# Patient Record
Sex: Male | Born: 1960 | Race: White | Hispanic: No | Marital: Married | Smoking: Current every day smoker
Health system: Southern US, Community
[De-identification: ages and names within clinical notes are randomized; demographics above are authoritative.]

## PROBLEM LIST (undated history)

## (undated) DIAGNOSIS — Z8619 Personal history of other infectious and parasitic diseases: Secondary | ICD-10-CM

## (undated) DIAGNOSIS — K219 Gastro-esophageal reflux disease without esophagitis: Secondary | ICD-10-CM

## (undated) DIAGNOSIS — C4491 Basal cell carcinoma of skin, unspecified: Secondary | ICD-10-CM

## (undated) DIAGNOSIS — L409 Psoriasis, unspecified: Secondary | ICD-10-CM

## (undated) DIAGNOSIS — C61 Malignant neoplasm of prostate: Secondary | ICD-10-CM

## (undated) HISTORY — DX: Malignant neoplasm of prostate: C61

## (undated) HISTORY — DX: Gastro-esophageal reflux disease without esophagitis: K21.9

## (undated) HISTORY — DX: Basal cell carcinoma of skin, unspecified: C44.91

## (undated) HISTORY — DX: Personal history of other infectious and parasitic diseases: Z86.19

## (undated) HISTORY — DX: Psoriasis, unspecified: L40.9

---

## 2012-06-12 ENCOUNTER — Ambulatory Visit: Payer: Self-pay | Admitting: Family Medicine

## 2012-06-12 HISTORY — PX: OTHER SURGICAL HISTORY: SHX169

## 2012-06-14 HISTORY — PX: OTHER SURGICAL HISTORY: SHX169

## 2012-06-30 ENCOUNTER — Ambulatory Visit: Payer: Self-pay | Admitting: Family Medicine

## 2012-07-14 ENCOUNTER — Ambulatory Visit: Payer: Self-pay | Admitting: Family Medicine

## 2012-07-18 ENCOUNTER — Ambulatory Visit: Payer: Self-pay | Admitting: Family Medicine

## 2012-07-18 HISTORY — PX: CT SCAN: SHX5351

## 2012-07-31 HISTORY — PX: UPPER GI ENDOSCOPY: SHX6162

## 2012-07-31 LAB — HM COLONOSCOPY

## 2012-08-15 ENCOUNTER — Ambulatory Visit (INDEPENDENT_AMBULATORY_CARE_PROVIDER_SITE_OTHER): Payer: BC Managed Care – PPO | Admitting: Pulmonary Disease

## 2012-08-15 ENCOUNTER — Encounter: Payer: Self-pay | Admitting: Pulmonary Disease

## 2012-08-15 VITALS — BP 110/76 | HR 98 | Ht 69.5 in | Wt 197.0 lb

## 2012-08-15 DIAGNOSIS — R918 Other nonspecific abnormal finding of lung field: Secondary | ICD-10-CM

## 2012-08-15 DIAGNOSIS — R1011 Right upper quadrant pain: Secondary | ICD-10-CM

## 2012-08-15 DIAGNOSIS — R05 Cough: Secondary | ICD-10-CM

## 2012-08-15 DIAGNOSIS — R059 Cough, unspecified: Secondary | ICD-10-CM

## 2012-08-15 DIAGNOSIS — R911 Solitary pulmonary nodule: Secondary | ICD-10-CM

## 2012-08-15 NOTE — Assessment & Plan Note (Signed)
Mr. Troy Cuevas has some cough with sputum production which is very likely related to his 30+ pack year smoking history. He does not have evidence of COPD. It is not uncommon for people to produce sputum in the months after they have quit smoking. If this does not resolve in 2-3 months then he could be treated with an inhaler such as Foradil as long acting beta blockers have been shown to be effective for sputum production in patients with chronic bronchitis. At this point I see no indication to start treatment today.

## 2012-08-15 NOTE — Assessment & Plan Note (Signed)
I think the most likely explanation for Mr. Troy Cuevas chronic right upper abdominal pain which is exacerbated by eating food is a biliary process such as gallbladder disease versus a bile duct problem.  The only respiratory/pulmonary problem that could potentially cause pain in the area that he describes that has not been evaluated yet is a pulmonary embolism.  Plan: -CT angiogram of the chest to evaluate for pulmonary embolism -If there is no pulmonary embolism but I think he should be evaluated by a general surgeon for cholecystectomy versus a gastroenterologist for EUS

## 2012-08-15 NOTE — Patient Instructions (Addendum)
We will send you for the CT scan of your chest to look for a pulmonary embolism (this is different than the last test because it will look for a blood clot in your lung) Schedule a follow up visit with Dr. Sherrie Mustache You need to have a follow up CT of your chest in 6 months to look at the pulmonary nodules

## 2012-08-15 NOTE — Assessment & Plan Note (Signed)
Radiology was able to see 3 mm pulmonary nodules. I was unable to appreciate this. Based on his smoking history radiology has recommended that he undergo a repeat CT scan of the chest in 6 months time. This is very reasonable.

## 2012-08-15 NOTE — Progress Notes (Signed)
Subjective:    Patient ID: Troy Cuevas, male    DOB: Mar 31, 1961, 52 y.o.   MRN: [id number]  HPI  This is a 52 year old male who smoked one pack of cigarettes daily for approximately 33 years who comes her clinic today for evaluation of right-sided chest and abdominal pain. He states that in November 2013 after extending he developed the sudden onset of pain in the right chest and and right upper quadrant. This pain is constant and radiates up to his right shoulder. The pain is worsened with eating and taking a deep breath. Improves with anorexia. The pain has been persistent ever since November. He was treated for "a lung infection" by a urgent care facility but this did not improve his symptoms. He does have a cough productive of scant, white but sticky sputum worse in the mornings. He states that he quit smoking cigarettes in November when this problem occurred. He has been evaluated extensively with a CT of his chest and abdomen, a right upper quadrant ultrasound, and a colonoscopy and upper GI. All of this have been "okay". He did have a pulmonary nodule seen on chest imaging.   History reviewed. No pertinent past medical history.   Family History  Problem Relation Age of Onset  . Hypertension Mother   . Diabetes Mother   . Hypertension Father   . Diabetes Father   . Cancer Father      History   Social History  . Marital Status: Married    Spouse Name: N/A    Number of Children: N/A  . Years of Education: N/A   Occupational History  . Not on file.   Social History Main Topics  . Smoking status: Former Smoker -- 1.00 packs/day for 35 years    Types: Cigarettes    Quit date: 05/25/2012  . Smokeless tobacco: Never Used  . Alcohol Use: No  . Drug Use: No  . Sexually Active: Not on file   Other Topics Concern  . Not on file   Social History Narrative  . No narrative on file     No Known Allergies   No outpatient prescriptions prior to visit.   No  facility-administered medications prior to visit.      Review of Systems  Constitutional: Negative for fever and unexpected weight change.  HENT: Positive for sneezing. Negative for ear pain, nosebleeds, congestion, sore throat, rhinorrhea, trouble swallowing, dental problem, postnasal drip and sinus pressure.   Eyes: Negative for redness and itching.  Respiratory: Positive for cough, chest tightness, shortness of breath and wheezing.   Cardiovascular: Positive for palpitations. Negative for leg swelling.  Gastrointestinal: Negative for nausea and vomiting.  Genitourinary: Positive for dysuria and difficulty urinating.  Musculoskeletal: Negative for joint swelling.  Skin: Negative for rash.  Neurological: Positive for headaches.  Hematological: Does not bruise/bleed easily.  Psychiatric/Behavioral: Negative for dysphoric mood. The patient is not nervous/anxious.        Objective:   Physical Exam  Filed Vitals:   08/15/12 1605  BP: 110/76  Pulse: 98  Height: 5' 9.5" (1.765 m)  Weight: 197 lb (89.359 kg)  SpO2: 98%   Gen: well appearing, no acute distress HEENT: NCAT, PERRL, EOMi, OP clear, neck supple without masses PULM: CTA B CV: RRR, no mgr, no JVD AB: BS+, positive Murphy sign, bowel sounds are positive nontender nondistended no mass Ext: warm, no edema, no clubbing, no cyanosis Derm: no rash or skin breakdown Neuro: A&Ox4, CN II-XII intact, strength 5/5 in  all 4 extremities  06/12/2012 CT scan of the chest 3 mm pulmonary nodules, no other parenchymal abnormality January 2014 right upper quadrant ultrasound normal gallbladder no stones January 2014 CT scan of the abdomen no evidence of intra-abdominal pathology 08/15/2012 simple spirometry normal     Assessment & Plan:   Abdominal pain, right upper quadrant I think the most likely explanation for Troy Cuevas chronic right upper abdominal pain which is exacerbated by eating food is a biliary process such as  gallbladder disease versus a bile duct problem.  The only respiratory/pulmonary problem that could potentially cause pain in the area that he describes that has not been evaluated yet is a pulmonary embolism.  Plan: -CT angiogram of the chest to evaluate for pulmonary embolism -If there is no pulmonary embolism but I think he should be evaluated by a general surgeon for cholecystectomy versus a gastroenterologist for EUS  Pulmonary nodules Radiology was able to see 3 mm pulmonary nodules. I was unable to appreciate this. Based on his smoking history radiology has recommended that he undergo a repeat CT scan of the chest in 6 months time. This is very reasonable.  Cough Troy Cuevas has some cough with sputum production which is very likely related to his 30+ pack year smoking history. He does not have evidence of COPD. It is not uncommon for people to produce sputum in the months after they have quit smoking. If this does not resolve in 2-3 months then he could be treated with an inhaler such as Foradil as long acting beta blockers have been shown to be effective for sputum production in patients with chronic bronchitis. At this point I see no indication to start treatment today.   Updated Medication List Outpatient Encounter Prescriptions as of 08/15/2012  Medication Sig Dispense Refill  . omeprazole (PRILOSEC OTC) 20 MG tablet Take 20 mg by mouth daily.       No facility-administered encounter medications on file as of 08/15/2012.

## 2012-08-16 ENCOUNTER — Telehealth: Payer: Self-pay | Admitting: *Deleted

## 2012-08-16 NOTE — Telephone Encounter (Signed)
Message copied by Christen Butter on Wed Aug 16, 2012 12:00 PM ------      Message from: Lupita Leash      Created: Tue Aug 15, 2012  5:38 PM       Hi,            Can someone leave a message with Dr. Mila Merry on 2/19 to let him know that I would like to speak with him about Mr. Judie Grieve Bowe?  He can call me [phone] or page me [phone]            Thanks,      Heber Bolan ------

## 2012-08-16 NOTE — Telephone Encounter (Signed)
Called and spoke with Troy Cuevas and Dr. Theodis Aguas office and LM with her to have Dr. Sherrie Mustache call or page Dr Kendrick Fries Will forward to him as an FYI that this was done

## 2012-08-23 ENCOUNTER — Ambulatory Visit: Payer: Self-pay | Admitting: Pulmonary Disease

## 2012-08-25 ENCOUNTER — Telehealth: Payer: Self-pay | Admitting: Pulmonary Disease

## 2012-08-25 DIAGNOSIS — R0600 Dyspnea, unspecified: Secondary | ICD-10-CM

## 2012-08-25 NOTE — Telephone Encounter (Signed)
I spoke with pt and is aware [facility]Q [place] be [place] and will get the message over to him. Please advise thanks

## 2012-08-29 NOTE — Telephone Encounter (Signed)
Pt called back- still waiting for test results. Wants to speak to someone today. Troy Cuevas

## 2012-08-29 NOTE — Telephone Encounter (Signed)
I called him today.  Unfortunately the wrong study was ordered.  He needed a CT chest with contrast to look for pulmonary embolism.  Instead he had a CT chest with contrast to look for a pulmonary nodule.  Because of this we were unable to see if he had a blood clot or not.  I told him that we will find out how to get the CT Angio chest to evaluate for pulmonary embolism.

## 2012-08-31 NOTE — Telephone Encounter (Signed)
I spoke with Dr Allena Katz He states that he already reviewed the CT and there is no way to r/o a PE with the technique that they used since the test was ordered to evaluate nodule He states that if we are still concerned about PE, another scan will need to be ordered I have tried to call the pt and see how he is doing, but did not get an answer Spoke with Dr. Kendrick Fries and he says to go ahead and order a ct angio with cm to r/o PE I will talk with the [facility] before they contact the pt to find out an idea about the cost  Order sent to [facility]

## 2012-08-31 NOTE — Telephone Encounter (Signed)
Spoke with pt He is aware of CT date, time and directions, and NPO 2 hrs before I advised will call him with results once read Again, apologized for our mistake

## 2012-08-31 NOTE — Telephone Encounter (Signed)
He were told on Tues 3/4 that a radiologist at [facility] would review the ct and call Dr Kendrick Fries I spoke with Dr Kendrick Fries and he still had not heard anything and so I called [facility] and spoke with Herbert Seta in Radiology She gave me Dr Eliane Decree pager (he is the radiologist who read the CT) pager- [phone] I have paged him and am awaiting response

## 2012-08-31 NOTE — Telephone Encounter (Signed)
Troy Cuevas has scheduled ct chest angio for tomorrow pm at 2:15 pm  NPO 2 hrs before [facility] 3rd floor

## 2012-09-01 ENCOUNTER — Other Ambulatory Visit: Payer: BC Managed Care – PPO

## 2012-09-04 ENCOUNTER — Ambulatory Visit (INDEPENDENT_AMBULATORY_CARE_PROVIDER_SITE_OTHER)
Admission: RE | Admit: 2012-09-04 | Discharge: 2012-09-04 | Disposition: A | Discharge status: Home / Self Care | Payer: BC Managed Care – PPO | Source: Ambulatory Visit | Attending: Pulmonary Disease | Admitting: Pulmonary Disease

## 2012-09-04 DIAGNOSIS — R0600 Dyspnea, unspecified: Secondary | ICD-10-CM

## 2012-09-04 MED ORDER — IOHEXOL 350 MG/ML SOLN
80.0000 mL | Freq: Once | INTRAVENOUS | Status: AC | PRN
  Administered 2012-09-04: 80 mL via INTRAVENOUS

## 2012-09-05 ENCOUNTER — Other Ambulatory Visit: Payer: BC Managed Care – PPO

## 2012-09-06 ENCOUNTER — Telehealth: Payer: Self-pay | Admitting: Pulmonary Disease

## 2012-09-06 NOTE — Telephone Encounter (Signed)
Spoke with pt and notified CT was neg for PE Offered ov with BQ for next wk, since he states still has occ SOB He states will wait and see if he feels better and if not call us back  I advised to seek emergent care should his symptoms worsen and he verbalized understanding

## 2012-09-06 NOTE — Telephone Encounter (Signed)
I spoke with pt. He stated he has a lm from leslie to return her call. i do not see anything in EPIC. Please advise Verlon Au if you called pt with results. thanks

## 2012-09-11 ENCOUNTER — Encounter: Payer: Self-pay | Admitting: Pulmonary Disease

## 2012-09-26 ENCOUNTER — Ambulatory Visit: Payer: Self-pay | Admitting: Family Medicine

## 2013-02-06 ENCOUNTER — Ambulatory Visit: Payer: Self-pay | Admitting: Family Medicine

## 2014-02-08 ENCOUNTER — Ambulatory Visit: Payer: Self-pay | Admitting: Family Medicine

## 2014-02-27 DIAGNOSIS — C61 Malignant neoplasm of prostate: Secondary | ICD-10-CM | POA: Insufficient documentation

## 2014-02-27 DIAGNOSIS — Z8546 Personal history of malignant neoplasm of prostate: Secondary | ICD-10-CM | POA: Insufficient documentation

## 2014-02-27 LAB — LIPID PANEL
Cholesterol: 170 mg/dL (ref 0–200)
HDL: 35 mg/dL (ref 35–70)
LDL CALC: 106 mg/dL
Triglycerides: 145 mg/dL (ref 40–160)

## 2014-02-27 LAB — BASIC METABOLIC PANEL
BUN: 12 mg/dL (ref 4–21)
Creatinine: 0.9 mg/dL (ref 0.6–1.3)
Glucose: 102 mg/dL
Potassium: 4.8 mmol/L (ref 3.4–5.3)
Sodium: 138 mmol/L (ref 137–147)

## 2014-02-27 LAB — HEPATIC FUNCTION PANEL
ALT: 49 U/L — AB (ref 10–40)
AST: 37 U/L (ref 14–40)

## 2014-02-27 LAB — PSA: PSA: 25.6

## 2014-03-28 ENCOUNTER — Ambulatory Visit: Payer: Self-pay | Admitting: Urology

## 2014-12-05 IMAGING — US ABDOMEN ULTRASOUND LIMITED
1 series · 14 of 25 positions shown · non-contrast
Comparison: none

REASON FOR EXAM: RUQ Abd Pain
COMMENTS:

[Series 1: abdomen ultrasound limited · 0.25mm/px · 14 of 69 slices shown]
[im 1/69]
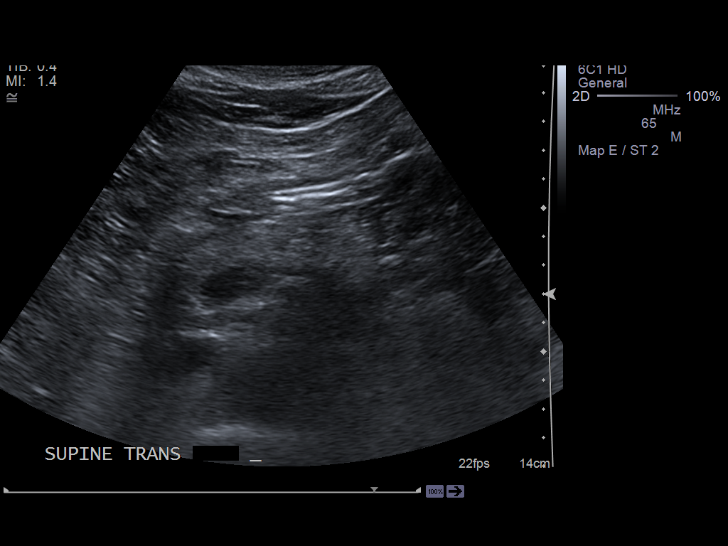
[im 6/69]
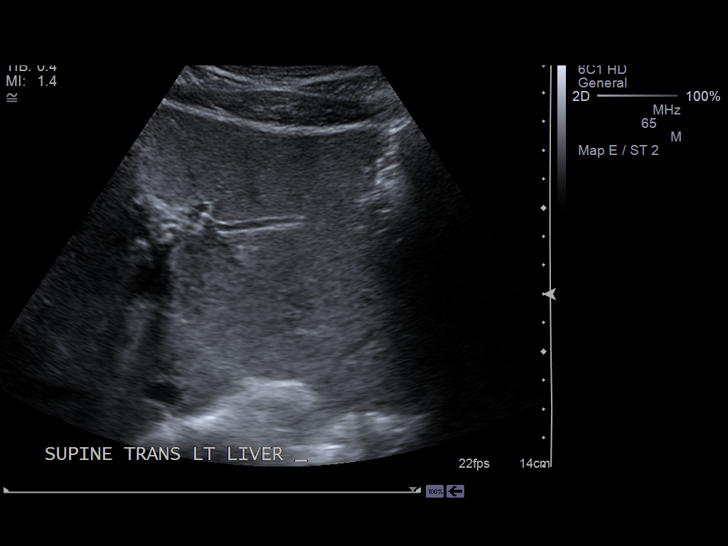
[im 12/69]
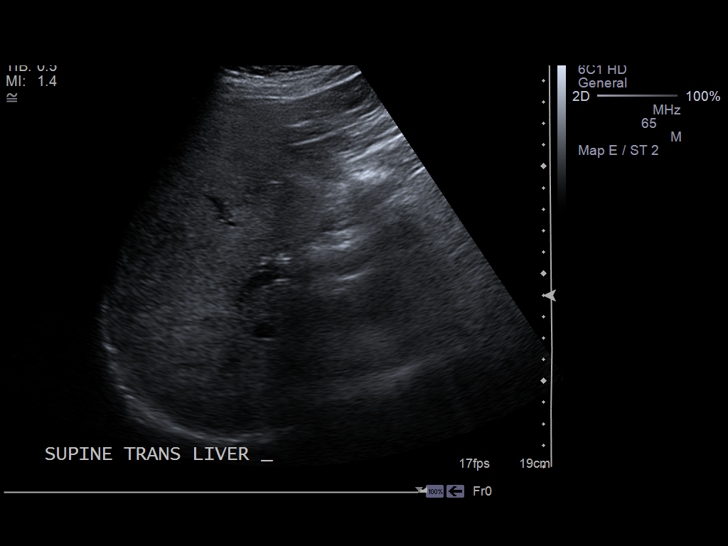
[im 18/69]
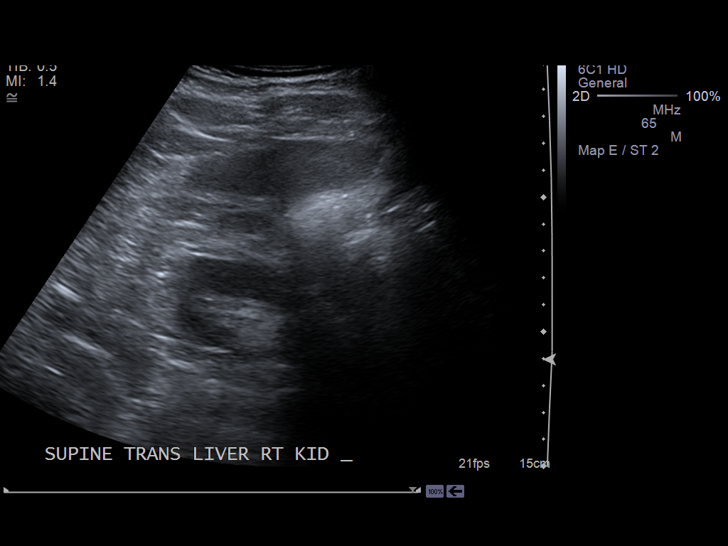
[im 23/69]
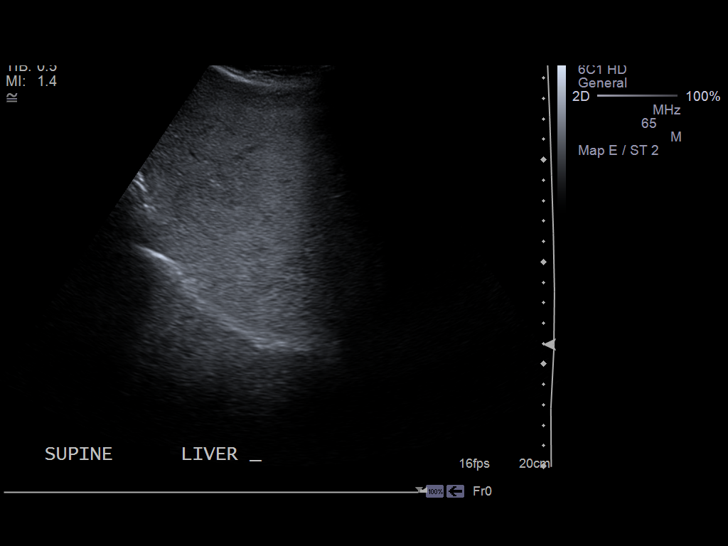
[im 26/69]
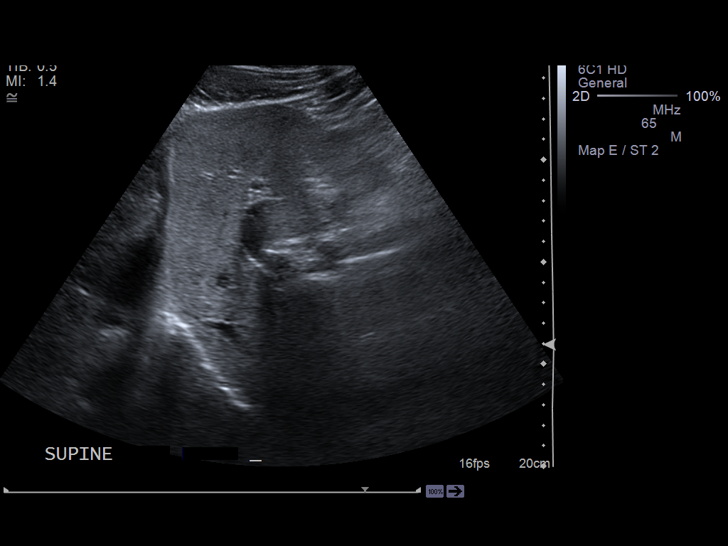
[im 32/69]
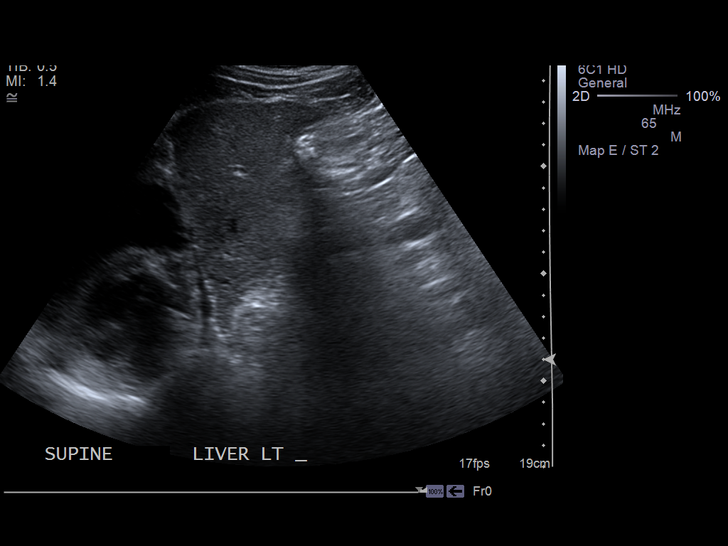
[im 37/69]
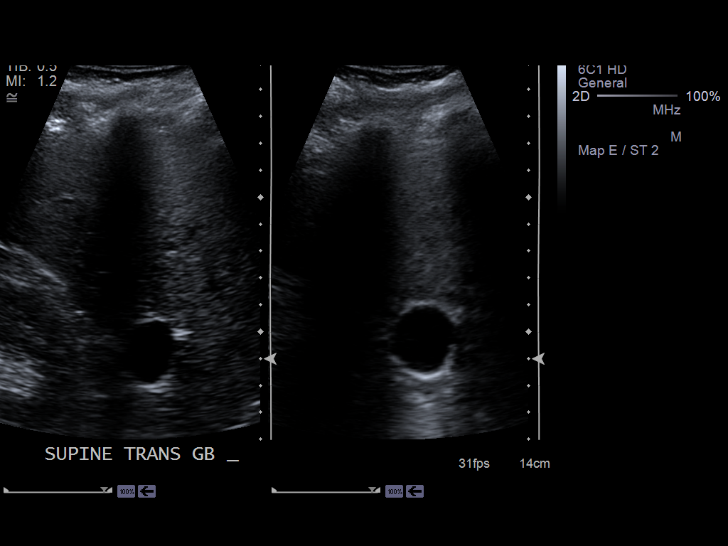
[im 43/69]
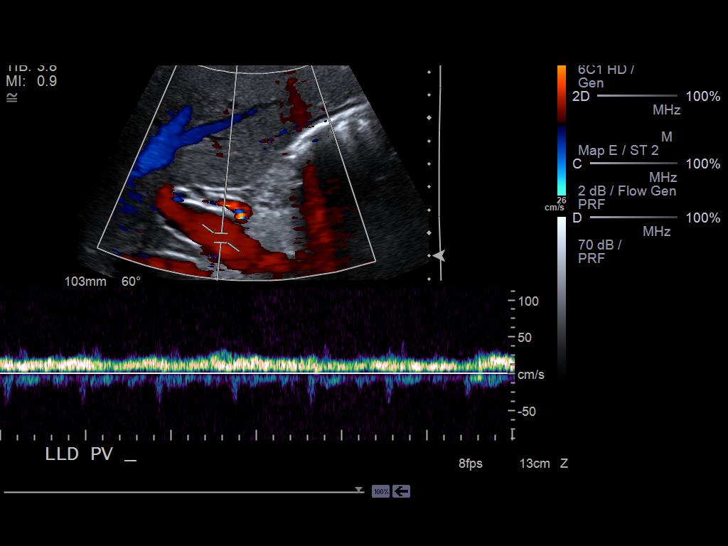
[im 46/69]
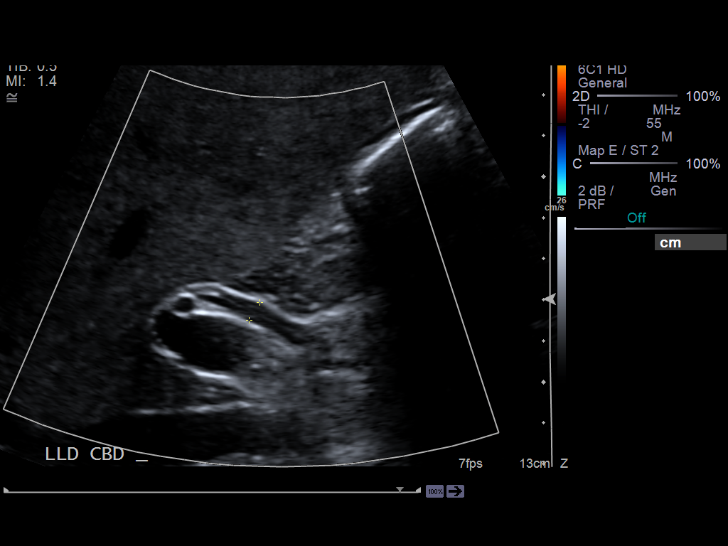
[im 52/69]
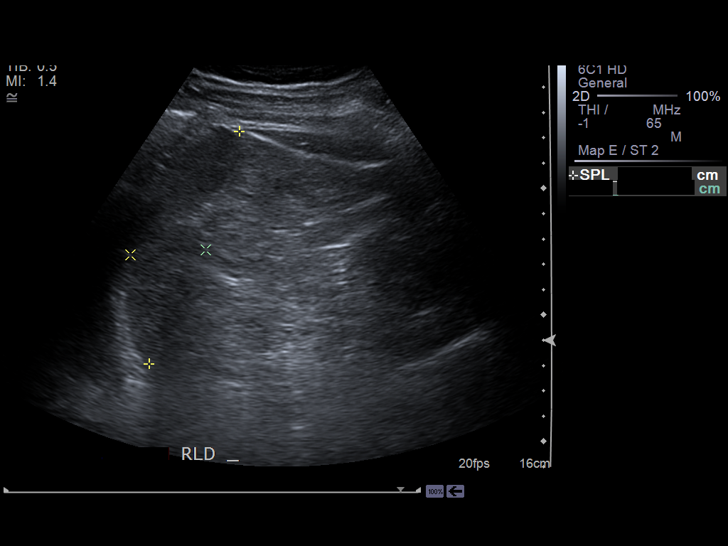
[im 57/69]
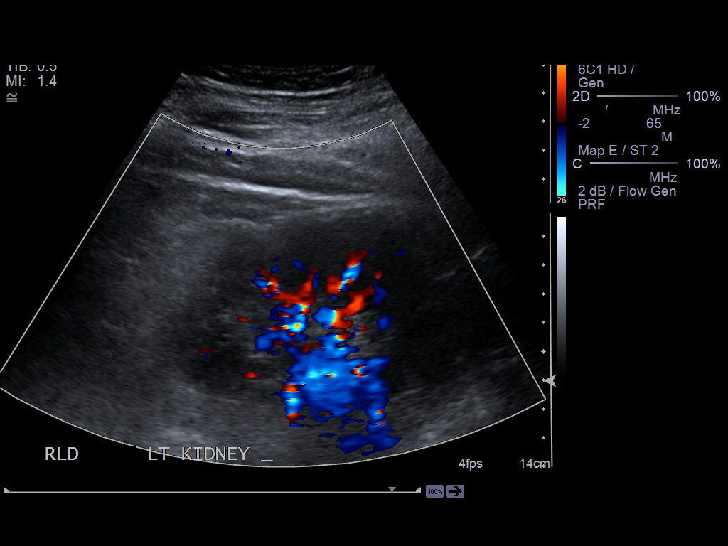
[im 63/69]
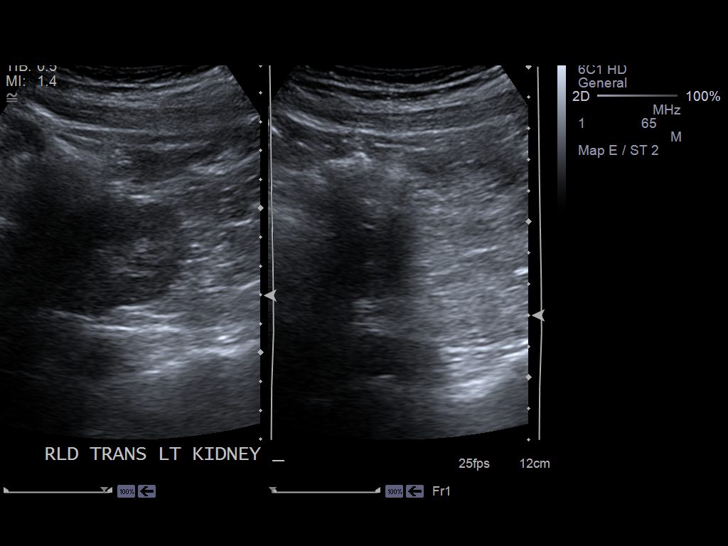
[im 69/69]
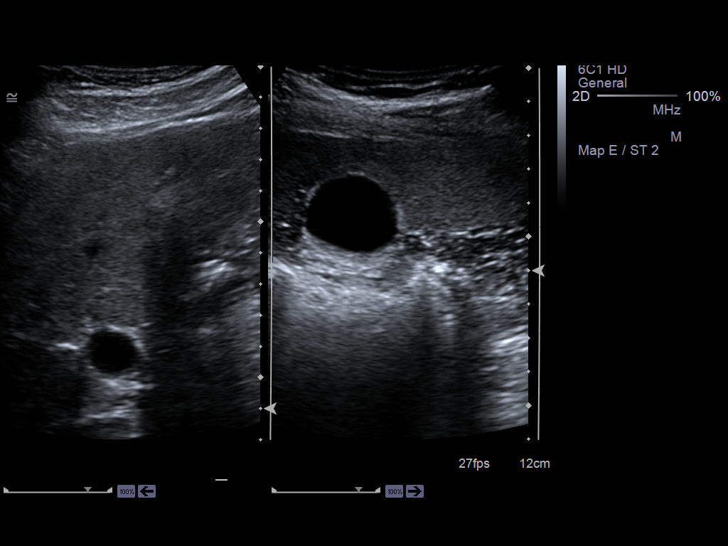

[14 of 25 positions shown; findings below may reference images not displayed]

PROCEDURE:     KIMHUNG - KIMHUNG ABDOMEN UPPER GENERAL  - June 30, 2012  [DATE]

RESULT:     Is abdominal sonogram is performed. The visualized head and body
the pancreas appear normal. The tail was not well-seen. The hepatic
echotexture appears within normal limits. There is no intrahepatic or
extrahepatic biliary ductal dilation. The common bile duct is borderline
distended at 5.1 mm. No stones are evident. Gallbladder wall thickness is
normal at 1.7 mm. Portal venous flow is normal. The proximal inferior vena
cava is patent. The aorta shows no aneurysm. The kidneys and spleen appear
normal. The spleen measures 9.6 cm. The right kidney is 11.30 x 5.46 x
cm. The left kidney measures 11.0 x 5.01 x 6.09 cm. Neither kidney shows a
stone, a solid mass or cystic mass.
IMPRESSION: 1. Poor visualization of the pancreatic tail. Common bile duct diameter is
borderline enlarged at 5.1 mm. Otherwise, unremarkable abdominal sonogram.

[REDACTED]

## 2015-01-15 ENCOUNTER — Ambulatory Visit (INDEPENDENT_AMBULATORY_CARE_PROVIDER_SITE_OTHER): Payer: BC Managed Care – PPO | Admitting: Family Medicine

## 2015-01-15 ENCOUNTER — Encounter: Payer: Self-pay | Admitting: Family Medicine

## 2015-01-15 VITALS — BP 108/78 | HR 89 | Temp 98.1°F | Resp 16 | Ht 69.0 in | Wt 204.0 lb

## 2015-01-15 DIAGNOSIS — R030 Elevated blood-pressure reading, without diagnosis of hypertension: Secondary | ICD-10-CM | POA: Diagnosis not present

## 2015-01-15 DIAGNOSIS — C4491 Basal cell carcinoma of skin, unspecified: Secondary | ICD-10-CM | POA: Insufficient documentation

## 2015-01-15 DIAGNOSIS — IMO0001 Reserved for inherently not codable concepts without codable children: Secondary | ICD-10-CM

## 2015-01-15 DIAGNOSIS — J309 Allergic rhinitis, unspecified: Secondary | ICD-10-CM | POA: Insufficient documentation

## 2015-01-15 DIAGNOSIS — R200 Anesthesia of skin: Secondary | ICD-10-CM | POA: Insufficient documentation

## 2015-01-15 DIAGNOSIS — L989 Disorder of the skin and subcutaneous tissue, unspecified: Secondary | ICD-10-CM | POA: Insufficient documentation

## 2015-01-15 DIAGNOSIS — K219 Gastro-esophageal reflux disease without esophagitis: Secondary | ICD-10-CM | POA: Insufficient documentation

## 2015-01-15 NOTE — Patient Instructions (Addendum)
Reduce caffeine intake (coffee) by half   .DASH Eating Plan DASH stands for "Dietary Approaches to Stop Hypertension." The DASH eating plan is a healthy eating plan that has been shown to reduce high blood pressure (hypertension). Additional health benefits may include reducing the risk of type 2 diabetes mellitus, heart disease, and stroke. The DASH eating plan may also help with weight loss. WHAT DO I NEED TO KNOW ABOUT THE DASH EATING PLAN? For the DASH eating plan, you will follow these general guidelines:  Choose foods with a percent daily value for sodium of less than 5% (as listed on the food label).  Use salt-free seasonings or herbs instead of table salt or sea salt.  Check with your health care provider or pharmacist before using salt substitutes.  Eat lower-sodium products, often labeled as "lower sodium" or "no salt added."  Eat fresh foods.  Eat more vegetables, fruits, and low-fat dairy products.  Choose whole grains. Look for the word "whole" as the first word in the ingredient list.  Choose fish and skinless chicken or [place] more often than red meat. Limit fish, poultry, and meat to 6 oz (170 g) each day.  Limit sweets, desserts, sugars, and sugary drinks.  Choose heart-healthy fats.  Limit cheese to 1 oz (28 g) per day.  Eat more home-cooked food and less restaurant, buffet, and fast food.  Limit fried foods.  Cook foods using methods other than frying.  Limit canned vegetables. If you do use them, rinse them well to decrease the sodium.  When eating at a restaurant, ask that your food be prepared with less salt, or no salt if possible. WHAT FOODS CAN I EAT? Seek help from a dietitian for individual calorie needs. Grains Whole grain or whole wheat bread. Brown rice. Whole grain or whole wheat pasta. Quinoa, bulgur, and whole grain cereals. Low-sodium cereals. Corn or whole wheat flour tortillas. Whole grain cornbread. Whole grain crackers. Low-sodium  crackers. Vegetables Fresh or frozen vegetables (raw, steamed, roasted, or grilled). Low-sodium or reduced-sodium tomato and vegetable juices. Low-sodium or reduced-sodium tomato sauce and paste. Low-sodium or reduced-sodium canned vegetables.  Fruits All fresh, canned (in natural juice), or frozen fruits. Meat and Other Protein Products Ground beef (85% or leaner), grass-fed beef, or beef trimmed of fat. Skinless chicken or [place]. Ground chicken or [place]. Pork trimmed of fat. All fish and seafood. Eggs. Dried beans, peas, or lentils. Unsalted nuts and seeds. Unsalted canned beans. Dairy Low-fat dairy products, such as skim or 1% milk, 2% or reduced-fat cheeses, low-fat ricotta or cottage cheese, or plain low-fat yogurt. Low-sodium or reduced-sodium cheeses. Fats and Oils Tub margarines without trans fats. Light or reduced-fat mayonnaise and salad dressings (reduced sodium). Avocado. Safflower, olive, or canola oils. Natural peanut or almond butter. Other Unsalted popcorn and pretzels. The items listed above may not be a complete list of recommended foods or beverages. Contact your dietitian for more options. WHAT FOODS ARE NOT RECOMMENDED? Grains White bread. White pasta. White rice. Refined cornbread. Bagels and croissants. Crackers that contain trans fat. Vegetables Creamed or fried vegetables. Vegetables in a cheese sauce. Regular canned vegetables. Regular canned tomato sauce and paste. Regular tomato and vegetable juices. Fruits Dried fruits. Canned fruit in light or heavy syrup. Fruit juice. Meat and Other Protein Products Fatty cuts of meat. Ribs, chicken wings, bacon, sausage, bologna, salami, chitterlings, fatback, hot dogs, bratwurst, and packaged luncheon meats. Salted nuts and seeds. Canned beans with salt. Dairy Whole or 2% milk, cream,  half-and-half, and cream cheese. Whole-fat or sweetened yogurt. Full-fat cheeses or blue cheese. Nondairy creamers and whipped toppings.  Processed cheese, cheese spreads, or cheese curds. Condiments Onion and garlic salt, seasoned salt, table salt, and sea salt. Canned and packaged gravies. Worcestershire sauce. Tartar sauce. Barbecue sauce. Teriyaki sauce. Soy sauce, including reduced sodium. Steak sauce. Fish sauce. Oyster sauce. Cocktail sauce. Horseradish. Ketchup and mustard. Meat flavorings and tenderizers. Bouillon cubes. Hot sauce. Tabasco sauce. Marinades. Taco seasonings. Relishes. Fats and Oils Butter, stick margarine, lard, shortening, ghee, and bacon fat. Coconut, palm kernel, or palm oils. Regular salad dressings. Other Pickles and olives. Salted popcorn and pretzels. The items listed above may not be a complete list of foods and beverages to avoid. Contact your dietitian for more information. WHERE CAN I FIND MORE INFORMATION? National Heart, Lung, and Blood Institute: travelstabloid.com Document Released: 06/03/2011 Document Revised: 10/29/2013 Document Reviewed: 04/18/2013 [facility] Patient Information 2015 [place], [place]. This information is not intended to replace advice given to you by your health care provider. Make sure you discuss any questions you have with your health care provider.

## 2015-01-15 NOTE — Progress Notes (Signed)
       Patient: Troy Cuevas Male    DOB: [birth year]   54 y.o.   MRN: [id number] Visit Date: 01/26/2015  Today's Provider: [name], MD   Chief Complaint  Patient presents with  . Elevated blood pressure   Subjective:    HPI  Elevated blood pressure Patient's blood pressure had been elevated to 150/95. Has felt well, no chest pain, no light-headedness. No dizziness.   No Known Allergies Previous Medications   FLUTICASONE (FLONASE) 50 MCG/ACT NASAL SPRAY    Place 2 sprays into both nostrils daily.   OMEPRAZOLE (PRILOSEC) 40 MG CAPSULE    Take 1 capsule by mouth. 30 minutes prior to evening meal    Review of Systems  Constitutional: Negative for fever and fatigue.  Respiratory: Negative for chest tightness and shortness of breath.   Cardiovascular: Negative for leg swelling.  Gastrointestinal: Positive for abdominal pain.    History  Substance Use Topics  . Smoking status: Current Some Day Smoker -- 1.00 packs/day for 35 years    Types: Cigarettes    Last Attempt to Quit: 05/25/2012  . Smokeless tobacco: Never Used  . Alcohol Use: 0.0 oz/week    0 Standard drinks or equivalent per week     Comment: occasional    Objective:   BP 140/80 mmHg  Pulse 89  Temp(Src) 98.1 F (36.7 C) (Oral)  Resp 16  Ht 5\' 9"  (1.753 m)  Wt 204 lb (92.534 kg)  BMI 30.11 kg/m2  SpO2 98%  Physical Exam  General Appearance:    Alert, cooperative, no distress  Eyes:    PERRL, conjunctiva/corneas clear, EOM's intact       Lungs:     Clear to auscultation bilaterally, respirations unlabored  Heart:    Regular rate and rhythm  Neurologic:   Awake, alert, oriented x 3. No apparent focal neurological           defect.            Assessment & Plan:     1. Elevated blood pressure Better today in the office. Will work on avoiding sodium in diet and exercise regularly.      Recheck BP one month  [name], MD  [facility]

## 2015-03-21 ENCOUNTER — Ambulatory Visit (INDEPENDENT_AMBULATORY_CARE_PROVIDER_SITE_OTHER): Payer: BC Managed Care – PPO | Admitting: Family Medicine

## 2015-03-21 ENCOUNTER — Encounter: Payer: Self-pay | Admitting: Family Medicine

## 2015-03-21 VITALS — BP 158/96 | HR 80 | Temp 98.6°F | Resp 16 | Ht 69.0 in | Wt 206.0 lb

## 2015-03-21 DIAGNOSIS — Z23 Encounter for immunization: Secondary | ICD-10-CM

## 2015-03-21 DIAGNOSIS — I10 Essential (primary) hypertension: Secondary | ICD-10-CM

## 2015-03-21 DIAGNOSIS — J309 Allergic rhinitis, unspecified: Secondary | ICD-10-CM | POA: Diagnosis not present

## 2015-03-21 DIAGNOSIS — K219 Gastro-esophageal reflux disease without esophagitis: Secondary | ICD-10-CM

## 2015-03-21 DIAGNOSIS — R030 Elevated blood-pressure reading, without diagnosis of hypertension: Secondary | ICD-10-CM

## 2015-03-21 DIAGNOSIS — IMO0001 Reserved for inherently not codable concepts without codable children: Secondary | ICD-10-CM | POA: Insufficient documentation

## 2015-03-21 DIAGNOSIS — Z0000 Encounter for general adult medical examination without abnormal findings: Secondary | ICD-10-CM | POA: Diagnosis not present

## 2015-03-21 NOTE — Progress Notes (Signed)
Patient ID: Troy Cuevas, male   DOB: Jun 28, 1961, 54 y.o.   MRN: [id number]       Patient: Troy Cuevas, Male    DOB: 11-12-1960, 54 y.o.   MRN: [id number] Visit Date: 03/21/2015  Today's Provider: [name], MD   Chief Complaint  Patient presents with  . Annual Exam   Subjective:    Annual physical exam Troy Cuevas is a 54 y.o. male who presents today for health maintenance and complete physical. He feels well. He reports exercising not regularly. He reports he is sleeping well. Patient is due for Tdap and Flu vaccine. He will update on today's visit.  -----------------------------------------------------------------   Review of Systems  Constitutional: Negative.  Negative for fever, chills, appetite change and fatigue.  HENT: Positive for congestion. Negative for ear pain, hearing loss, nosebleeds and trouble swallowing.   Eyes: Negative.  Negative for pain and visual disturbance.  Respiratory: Negative.  Negative for cough, chest tightness and shortness of breath.   Cardiovascular: Negative.  Negative for chest pain, palpitations and leg swelling.  Gastrointestinal: Negative.  Negative for nausea, vomiting, abdominal pain, diarrhea, constipation and blood in stool.  Endocrine: Negative.  Negative for polydipsia, polyphagia and polyuria.  Genitourinary: Negative.  Negative for dysuria and flank pain.  Musculoskeletal: Positive for neck pain. Negative for myalgias, back pain, joint swelling, arthralgias and neck stiffness.  Skin: Negative.  Negative for color change, rash and wound.  Allergic/Immunologic: Negative.   Neurological: Negative.  Negative for dizziness, tremors, seizures, speech difficulty, weakness, light-headedness and headaches.  Hematological: Negative.   Psychiatric/Behavioral: Negative.  Negative for behavioral problems, confusion, sleep disturbance, dysphoric mood and decreased concentration. The patient is not nervous/anxious.   All other  systems reviewed and are negative.   Social History He  reports that he has been smoking Cigarettes.  He has a 35 pack-year smoking history. He has never used smokeless tobacco. He reports that he drinks alcohol. He reports that he does not use illicit drugs. Social History   Social History  . Marital Status: Married    Spouse Name: N/A  . Number of Children: N/A  . Years of Education: 12   Occupational History  . 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 Smoking status: Current Some Day Smoker -- 1.00 packs/day for 35 years    Types: Cigarettes    Last Attempt to Quit: 05/25/2012  . Smokeless tobacco: Never Used  . Alcohol Use: 0.0 oz/week    0 Standard drinks or equivalent per week     Comment: occasional   . Drug Use: No  . Sexual Activity: Not Asked   Other Topics Concern  . None   Social History Narrative    Patient Active Problem List   Diagnosis Date Noted  . Allergic rhinitis 01/15/2015  . Basal cell carcinoma 01/15/2015  . GERD (gastroesophageal reflux disease) 01/15/2015  . Lung nodule, multiple 01/15/2015  . Skin lesion 01/15/2015  . Prostate cancer 02/27/2014  . Abdominal pain, right upper quadrant 08/15/2012  . Pulmonary nodules 08/15/2012  . Cough 08/15/2012    Past Surgical History  Procedure Laterality Date  . Upper gi endoscopy  07/31/2012    Dr. Dionne Milo, H. Pylori neg; Normal biopsy gastric antrum  . Ct scan  07/18/2012    CT of  Abdomen; Scattered Diverticulosis of left colon  . Myocardial perfusion scan  06/14/2012    Normal  . Ct scan of chest  06/12/2012  with contrast; 3 tiny nodules in both Lungs. Recommned repeat in 6 months. Otherwise normal    Family History  Family Status  Relation Status Death Age  . Mother Alive   . Father Alive   . Sister Alive   . Brother Alive   . Sister Alive    His family history includes Cancer in his father; Diabetes in his father, mother, and sister; Hypertension in his father and  mother.    No Known Allergies  Previous Medications   FLUTICASONE (FLONASE) 50 MCG/ACT NASAL SPRAY    Place 2 sprays into both nostrils daily.   OMEPRAZOLE (PRILOSEC) 40 MG CAPSULE    Take 1 capsule by mouth. 30 minutes prior to evening meal    Patient Care Team: Birdie Sons, MD as PCP - General (Family Medicine)     Objective:   Filed Vitals:   03/21/15 1404 03/21/15 1442  BP: 158/82 158/96  Pulse: 80   Temp: 98.6 F (37 C)   Resp: 16   Height: 5\' 9"  (1.753 m)   Weight: 206 lb (93.441 kg)      Physical Exam   General Appearance:    Alert, cooperative, no distress, appears stated age, obese  Head:    Normocephalic, without obvious abnormality, atraumatic  Eyes:    PERRL, conjunctiva/corneas clear, EOM's intact, fundi    benign, both eyes       Ears:    Normal TM's and external ear canals, both ears  Nose:   Nares normal, septum midline, mucosa normal, no drainage   or sinus tenderness  Throat:   Lips, mucosa, and tongue normal; teeth and gums normal  Neck:   Supple, symmetrical, trachea midline, no adenopathy;       thyroid:  No enlargement/tenderness/nodules; no carotid   bruit or JVD  Back:     Symmetric, no curvature, ROM normal, no CVA tenderness  Lungs:     Clear to auscultation bilaterally, respirations unlabored  Chest wall:    No tenderness or deformity  Heart:    Regular rate and rhythm, S1 and S2 normal, no murmur, rub   or gallop  Abdomen:     Soft, non-tender, bowel sounds active all four quadrants,    no masses, no organomegaly  Genitalia:    deferred  Rectal:    deferred  Extremities:   Extremities normal, atraumatic, no cyanosis or edema  Pulses:   2+ and symmetric all extremities  Skin:   Skin color, texture, turgor normal, no rashes or lesions  Lymph nodes:   Cervical, supraclavicular, and axillary nodes normal  Neurologic:   CNII-XII intact. Normal strength, sensation and reflexes      throughout    Depression Screen No flowsheet data  found.    Assessment & Plan:     Routine Health Maintenance and Physical Exam  Exercise Activities and Dietary recommendations Goals    None       There is no immunization history on file for this patient.  Health Maintenance  Topic Date Due  . HIV Screening  12/12/1976  . TETANUS/TDAP  12/12/1980  . INFLUENZA VACCINE  01/27/2015  . COLONOSCOPY  07/31/2022  . Hepatitis C Screening  Completed      Discussed health benefits of physical activity, and encouraged him to engage in regular exercise appropriate for his age and condition.    --------------------------------------------------------------------  1. Annual physical exam  - Lipid panel - Comprehensive metabolic panel  2. Essential hypertension See how labs  look. Anticipate starting antihypertensive medication once we review labs.  - EKG 12-Lead - TSH  3. Gastroesophageal reflux disease, esophagitis presence not specified well controlled   4. Allergic rhinitis, unspecified allergic rhinitis type Doing well with steroid nasal spray  5. Need for Tdap vaccination  - Tdap vaccine greater than or equal to 7yo IM  6. Need for influenza vaccination  - Flu Vaccine QUAD 36+ mos IM  7. Tobacco use Smoking cessation instruction/counseling given:  counseled patient on the dangers of tobacco use, advised patient to stop smoking, and reviewed strategies to maximize success

## 2015-03-21 NOTE — Patient Instructions (Signed)
Please stop smoking 

## 2015-03-22 LAB — COMPREHENSIVE METABOLIC PANEL
A/G RATIO: 2.3 (ref 1.1–2.5)
ALBUMIN: 4.5 g/dL (ref 3.5–5.5)
ALK PHOS: 74 IU/L (ref 39–117)
ALT: 41 IU/L (ref 0–44)
AST: 27 IU/L (ref 0–40)
BUN / CREAT RATIO: 15 (ref 9–20)
BUN: 13 mg/dL (ref 6–24)
Bilirubin Total: <0.2 mg/dL (ref 0.0–1.2)
CALCIUM: 9.5 mg/dL (ref 8.7–10.2)
CO2: 26 mmol/L (ref 18–29)
Chloride: 99 mmol/L (ref 97–108)
Creatinine, Ser: 0.89 mg/dL (ref 0.76–1.27)
GFR calc Af Amer: 113 mL/min/{1.73_m2} (ref >59)
GFR, EST NON AFRICAN AMERICAN: 98 mL/min/{1.73_m2} (ref >59)
Globulin, Total: 2 g/dL (ref 1.5–4.5)
Glucose: 102 mg/dL — ABNORMAL HIGH (ref 65–99)
POTASSIUM: 4.8 mmol/L (ref 3.5–5.2)
SODIUM: 139 mmol/L (ref 134–144)
Total Protein: 6.5 g/dL (ref 6.0–8.5)

## 2015-03-22 LAB — LIPID PANEL
CHOLESTEROL TOTAL: 207 mg/dL — AB (ref 100–199)
Chol/HDL Ratio: 4.9 ratio units (ref 0.0–5.0)
HDL: 42 mg/dL (ref >39)
LDL Calculated: 93 mg/dL (ref 0–99)
TRIGLYCERIDES: 358 mg/dL — AB (ref 0–149)
VLDL CHOLESTEROL CAL: 72 mg/dL — AB (ref 5–40)

## 2015-03-22 LAB — TSH: TSH: 0.831 u[IU]/mL (ref 0.450–4.500)

## 2015-03-25 ENCOUNTER — Telehealth: Payer: Self-pay | Admitting: *Deleted

## 2015-03-25 MED ORDER — LISINOPRIL 10 MG PO TABS: 10.0000 mg | ORAL_TABLET | Freq: Every day | ORAL | Status: DC

## 2015-03-25 NOTE — Telephone Encounter (Signed)
Patient's wife was given results. Expressed understanding. Rx sent to pharmacy.

## 2015-03-25 NOTE — Telephone Encounter (Signed)
-----   Message from Birdie Sons, MD sent at 03/23/2015 10:03 PM EDT ----- Triglycerides are a little high at 358, should be under 200. Avoid sweets and starches in diet. Otherwise labs are normal.  Recommend he start lisinopril 10mg  daily for blood pressure. +30, rf x 0. Follow up in 3-4 weeks for BP check.

## 2015-04-21 ENCOUNTER — Ambulatory Visit (INDEPENDENT_AMBULATORY_CARE_PROVIDER_SITE_OTHER): Payer: BC Managed Care – PPO | Admitting: Family Medicine

## 2015-04-21 ENCOUNTER — Encounter: Payer: Self-pay | Admitting: Family Medicine

## 2015-04-21 VITALS — BP 102/62 | HR 75 | Temp 98.6°F | Resp 16 | Wt 206.0 lb

## 2015-04-21 DIAGNOSIS — R030 Elevated blood-pressure reading, without diagnosis of hypertension: Secondary | ICD-10-CM | POA: Diagnosis not present

## 2015-04-21 DIAGNOSIS — IMO0001 Reserved for inherently not codable concepts without codable children: Secondary | ICD-10-CM

## 2015-04-21 NOTE — Progress Notes (Signed)
       Patient: Troy Cuevas Male    DOB: 01/17/1961   54 y.o.   MRN: [id number] Visit Date: 04/21/2015  Today's Provider: [name], MD   Chief Complaint  Patient presents with  . Hypertension    follow  up    Subjective:    HPI  Hypertension, follow-up:  BP Readings from Last 3 Encounters:  04/21/15 102/62  03/21/15 158/96  01/26/15 108/78    He was last seen for hypertension 4 weeks ago.  BP at that visit was 158/82. Management since that visit includes starting Lisinopril 10mg  daily. He reports good compliance with treatment. He is having side effects. Started having a dry cough 2-3 weeks ago.  He is exercising. He is adherent to low salt diet.   Outside blood pressures are not being checked. He is experiencing none.  Patient denies chest pain, chest pressure/discomfort, claudication, dyspnea, exertional chest pressure/discomfort, fatigue, irregular heart beat, lower extremity edema, near-syncope, orthopnea, palpitations, paroxysmal nocturnal dyspnea, syncope and tachypnea.   Cardiovascular risk factors include hypertension and smoking/ tobacco exposure male gender.  Use of agents associated with hypertension: none.     Weight trend: stable Wt Readings from Last 3 Encounters:  04/21/15 206 lb (93.441 kg)  03/21/15 206 lb (93.441 kg)  01/15/15 204 lb (92.534 kg)    Current diet: in general, a "healthy" diet    ------------------------------------------------------------------------      No Known Allergies Previous Medications   FLUTICASONE (FLONASE) 50 MCG/ACT NASAL SPRAY    Place 2 sprays into both nostrils daily.   LISINOPRIL (PRINIVIL,ZESTRIL) 10 MG TABLET    Take 1 tablet (10 mg total) by mouth daily.   OMEPRAZOLE (PRILOSEC) 40 MG CAPSULE    Take 1 capsule by mouth. 30 minutes prior to evening meal    Review of Systems  Constitutional: Negative for fever, chills and appetite change.  Respiratory: Positive for cough. Negative for chest  tightness, shortness of breath and wheezing.   Cardiovascular: Negative for chest pain and palpitations.  Gastrointestinal: Negative for nausea, vomiting and abdominal pain.    Social History  Substance Use Topics  . Smoking status: Current Some Day Smoker -- 1.00 packs/day for 35 years    Types: Cigarettes    Last Attempt to Quit: 05/25/2012  . Smokeless tobacco: Never Used  . Alcohol Use: 0.0 oz/week    0 Standard drinks or equivalent per week     Comment: occasional    Objective:   BP 102/62 mmHg  Pulse 75  Temp(Src) 98.6 F (37 C) (Oral)  Resp 16  Wt 206 lb (93.441 kg)  SpO2 97%  Physical Exam   General Appearance:    Alert, cooperative, no distress  Eyes:    PERRL, conjunctiva/corneas clear, EOM's intact       Lungs:     Clear to auscultation bilaterally, respirations unlabored  Heart:    Regular rate and rhythm  Neurologic:   Awake, alert, oriented x 3. No apparent focal neurological           defect.           Assessment & Plan:     1. Elevated blood pressure Greatly improved with addition of lisinopril. Needs refill if labs are normal.  - Renal function panel       [name], MD  [facility]

## 2015-04-23 LAB — RENAL FUNCTION PANEL
ALBUMIN: 4.2 g/dL (ref 3.5–5.5)
BUN / CREAT RATIO: 17 (ref 9–20)
BUN: 13 mg/dL (ref 6–24)
CO2: 24 mmol/L (ref 18–29)
Calcium: 9.1 mg/dL (ref 8.7–10.2)
Chloride: 101 mmol/L (ref 97–106)
Creatinine, Ser: 0.76 mg/dL (ref 0.76–1.27)
GFR, EST AFRICAN AMERICAN: 120 mL/min/{1.73_m2} (ref >59)
GFR, EST NON AFRICAN AMERICAN: 104 mL/min/{1.73_m2} (ref >59)
GLUCOSE: 123 mg/dL — AB (ref 65–99)
POTASSIUM: 3.9 mmol/L (ref 3.5–5.2)
Phosphorus: 3.9 mg/dL (ref 2.5–4.5)
Sodium: 141 mmol/L (ref 136–144)

## 2015-04-24 ENCOUNTER — Other Ambulatory Visit: Payer: Self-pay | Admitting: Family Medicine

## 2015-05-15 ENCOUNTER — Other Ambulatory Visit: Payer: Self-pay | Admitting: Family Medicine

## 2015-06-01 ENCOUNTER — Other Ambulatory Visit: Payer: Self-pay | Admitting: Family Medicine

## 2015-09-24 ENCOUNTER — Other Ambulatory Visit: Payer: Self-pay | Admitting: Family Medicine

## 2015-10-23 ENCOUNTER — Other Ambulatory Visit: Payer: Self-pay | Admitting: Family Medicine

## 2015-12-19 ENCOUNTER — Other Ambulatory Visit: Payer: Self-pay | Admitting: Family Medicine

## 2016-01-17 ENCOUNTER — Other Ambulatory Visit: Payer: Self-pay | Admitting: Family Medicine

## 2016-03-18 ENCOUNTER — Other Ambulatory Visit: Payer: Self-pay | Admitting: Family Medicine

## 2016-04-06 ENCOUNTER — Encounter: Payer: Self-pay | Admitting: Family Medicine

## 2016-04-06 ENCOUNTER — Ambulatory Visit (INDEPENDENT_AMBULATORY_CARE_PROVIDER_SITE_OTHER): Payer: BC Managed Care – PPO | Admitting: Family Medicine

## 2016-04-06 VITALS — BP 132/78 | HR 88 | Temp 97.9°F | Resp 16 | Wt 213.0 lb

## 2016-04-06 DIAGNOSIS — I10 Essential (primary) hypertension: Secondary | ICD-10-CM | POA: Insufficient documentation

## 2016-04-06 DIAGNOSIS — Z720 Tobacco use: Secondary | ICD-10-CM

## 2016-04-06 DIAGNOSIS — K219 Gastro-esophageal reflux disease without esophagitis: Secondary | ICD-10-CM | POA: Diagnosis not present

## 2016-04-06 DIAGNOSIS — Z23 Encounter for immunization: Secondary | ICD-10-CM

## 2016-04-06 DIAGNOSIS — F17210 Nicotine dependence, cigarettes, uncomplicated: Secondary | ICD-10-CM | POA: Insufficient documentation

## 2016-04-06 NOTE — Progress Notes (Signed)
Patient: Troy Cuevas Male    DOB: 12-18-1960   55 y.o.   MRN: [id number] Visit Date: 04/06/2016  Today's Provider: [name], MD   Chief Complaint  Patient presents with  . Hypertension  . Gastroesophageal Reflux   Subjective:    HPI  Hypertension, follow-up:  BP Readings from Last 3 Encounters:  04/06/16 132/78  04/21/15 102/62  03/21/15 (!) 158/96    He was last seen for hypertension 12 months ago.  BP at that visit was 102/62. Management since that visit includes no changes. He reports good compliance with treatment. He is not having side effects.  He is not exercising. He is adherent to low salt diet.   Outside blood pressures are none. Patient denies chest pain, exertional chest pressure/discomfort, irregular heart beat, lower extremity edema and orthopnea.      Weight trend: stable Wt Readings from Last 3 Encounters:  04/06/16 213 lb (96.6 kg)  04/21/15 206 lb (93.4 kg)  03/21/15 206 lb (93.4 kg)    Current diet: well balanced    GERD, Follow up: Patient also comes in today to follow up on GERD. Patient reports that omeprazole has been controlling his symptoms well.   No Known Allergies   Current Outpatient Prescriptions:  .  fluticasone (FLONASE) 50 MCG/ACT nasal spray, PLACE 2 SPRAYS IN EACH NOSTRIL DAILY, Disp: 16 g, Rfl: 3 .  lisinopril (PRINIVIL,ZESTRIL) 10 MG tablet, TAKE 1 TABLET (10 MG TOTAL) BY MOUTH DAILY., Disp: 30 tablet, Rfl: 6 .  omeprazole (PRILOSEC) 40 MG capsule, 1 CAPSULE DR, ORAL, 30 MINUTES PRIOR TO EVENING MEAL, Disp: 30 capsule, Rfl: 11  Review of Systems  Constitutional: Negative.   Respiratory: Negative.   Cardiovascular: Negative.   Musculoskeletal: Negative.   Neurological: Negative.   Psychiatric/Behavioral: Negative.     Social History  Substance Use Topics  . Smoking status: Current Some Day Smoker    Packs/day: 1.00    Years: 35.00    Types: Cigarettes    Last attempt to quit: 05/25/2012  .  Smokeless tobacco: Never Used  . Alcohol use 0.0 oz/week     Comment: occasional    Objective:   BP 132/78 (BP Location: Right Arm, Patient Position: Sitting, Cuff Size: Normal)   Pulse 88   Temp 97.9 F (36.6 C)   Resp 16   Wt 213 lb (96.6 kg)   BMI 31.45 kg/m   Physical Exam  General Appearance:    Alert, cooperative, no distress, obese  Eyes:    PERRL, conjunctiva/corneas clear, EOM's intact       Lungs:     Occasional expiratory wheeze, no rales, , respirations unlabored  Heart:    Regular rate and rhythm  Neurologic:   Awake, alert, oriented x 3. No apparent focal neurological           defect.           Assessment & Plan:     1. Benign hypertension Well controlled.  Continue current medications.   - Lipid panel - Renal function panel  2. Gastroesophageal reflux disease, esophagitis presence not specified Completely controlled on omeprazole. Continue current medications.    3. Need for influenza vaccination  - Flu Vaccine QUAD 36+ mos PF IM (Fluarix & Fluzone Quad PF)   4. Smoker Counseled on benefits of smoking cessation.     The entirety of the information documented in the History of Present Illness, Review of Systems and Physical Exam were  personally obtained by me. Portions of this information were initially documented by Wilburt Finlay, CMA and reviewed by me for thoroughness and accuracy.    [name], MD  [facility]

## 2016-04-07 DIAGNOSIS — Z23 Encounter for immunization: Secondary | ICD-10-CM

## 2016-04-09 ENCOUNTER — Encounter: Payer: Self-pay | Admitting: Family Medicine

## 2016-04-09 DIAGNOSIS — E785 Hyperlipidemia, unspecified: Secondary | ICD-10-CM | POA: Insufficient documentation

## 2016-04-09 DIAGNOSIS — E781 Pure hyperglyceridemia: Secondary | ICD-10-CM | POA: Insufficient documentation

## 2016-04-09 LAB — LIPID PANEL
CHOLESTEROL TOTAL: 178 mg/dL (ref 100–199)
Chol/HDL Ratio: 5.7 ratio units — ABNORMAL HIGH (ref 0.0–5.0)
HDL: 31 mg/dL — AB (ref >39)
LDL CALC: 80 mg/dL (ref 0–99)
Triglycerides: 335 mg/dL — ABNORMAL HIGH (ref 0–149)
VLDL CHOLESTEROL CAL: 67 mg/dL — AB (ref 5–40)

## 2016-04-09 LAB — RENAL FUNCTION PANEL
ALBUMIN: 4.3 g/dL (ref 3.5–5.5)
BUN / CREAT RATIO: 21 — AB (ref 9–20)
BUN: 18 mg/dL (ref 6–24)
CO2: 23 mmol/L (ref 18–29)
Calcium: 9.1 mg/dL (ref 8.7–10.2)
Chloride: 99 mmol/L (ref 96–106)
Creatinine, Ser: 0.85 mg/dL (ref 0.76–1.27)
GFR, EST AFRICAN AMERICAN: 114 mL/min/{1.73_m2} (ref >59)
GFR, EST NON AFRICAN AMERICAN: 99 mL/min/{1.73_m2} (ref >59)
Glucose: 115 mg/dL — ABNORMAL HIGH (ref 65–99)
Phosphorus: 3.4 mg/dL (ref 2.5–4.5)
Potassium: 4.7 mmol/L (ref 3.5–5.2)
Sodium: 138 mmol/L (ref 134–144)

## 2016-04-12 ENCOUNTER — Telehealth: Payer: Self-pay | Admitting: Family Medicine

## 2016-04-12 NOTE — Telephone Encounter (Signed)
error 

## 2016-05-13 ENCOUNTER — Other Ambulatory Visit: Payer: Self-pay | Admitting: Family Medicine

## 2016-07-15 IMAGING — CT CT CHEST W/O CM
2 of 3 series · 15 of 36 positions shown, 18 images · non-contrast
Comparison: February 06, 2013

CLINICAL DATA: Pulmonary nodule ; chest pain

EXAM:
CT CHEST WITHOUT CONTRAST
TECHNIQUE: Multidetector CT imaging of the chest was performed following the
standard protocol without intravenous contrast material.

[Series 2: routine chest wo · axial · 0.78mm/px · z∈[-822,-522]mm · 12 of 72 slices shown, 15 images]
[im 6/72  mediastinal]
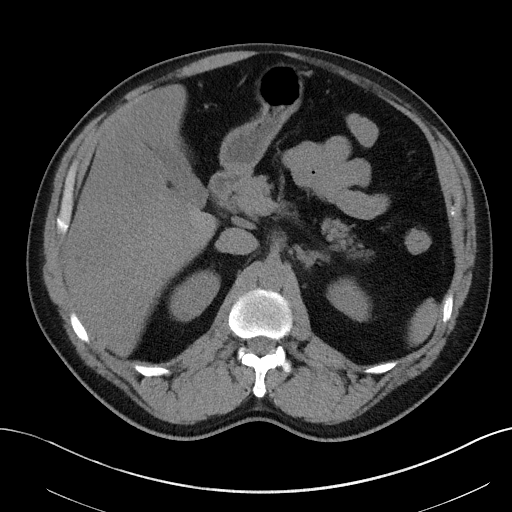
[im 6/72  lung]
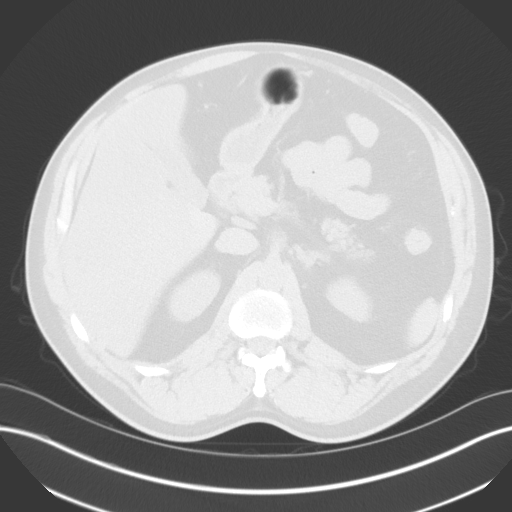
[im 11/72  lung]
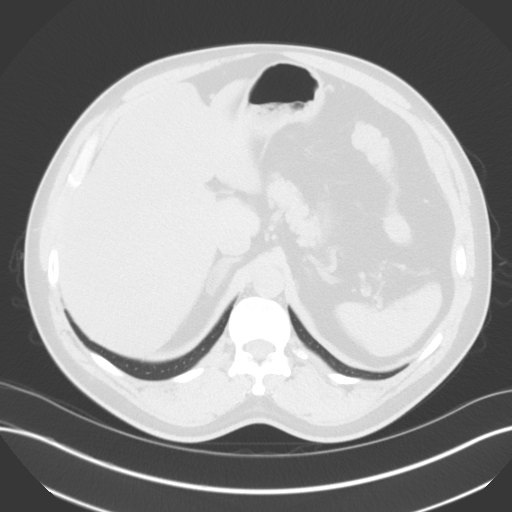
[im 16/72  lung]
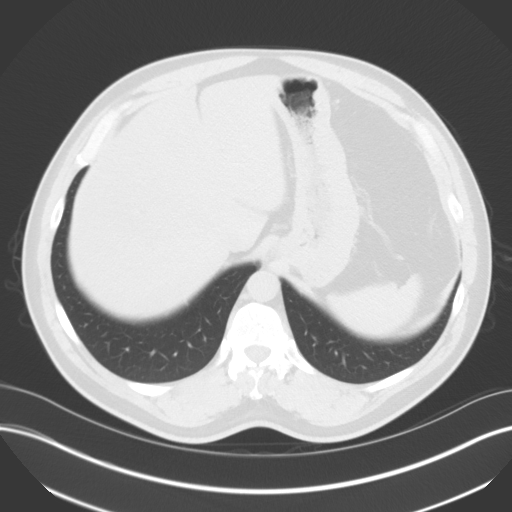
[im 22/72  lung]
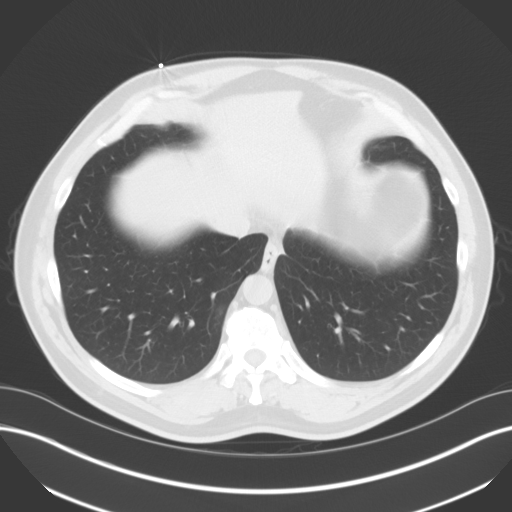
[im 27/72  mediastinal]
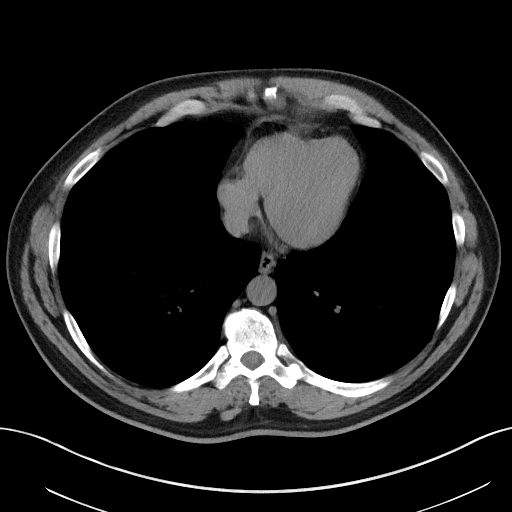
[im 27/72  lung]
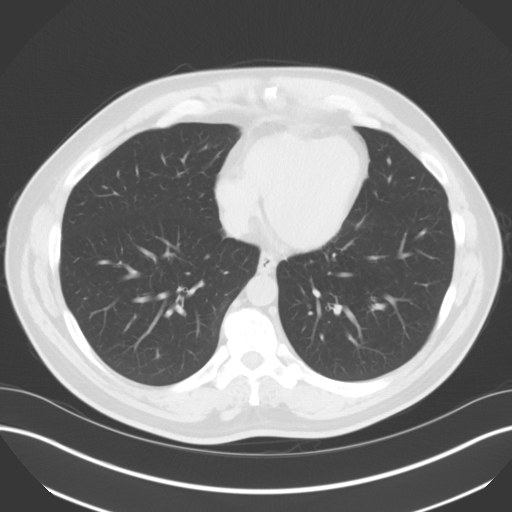
[im 32/72  lung]
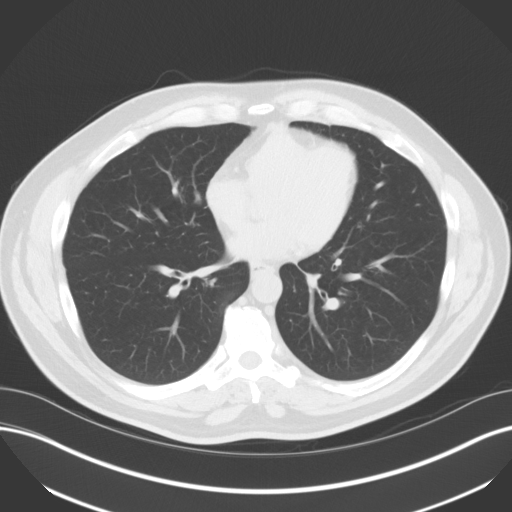
[im 40/72  lung]
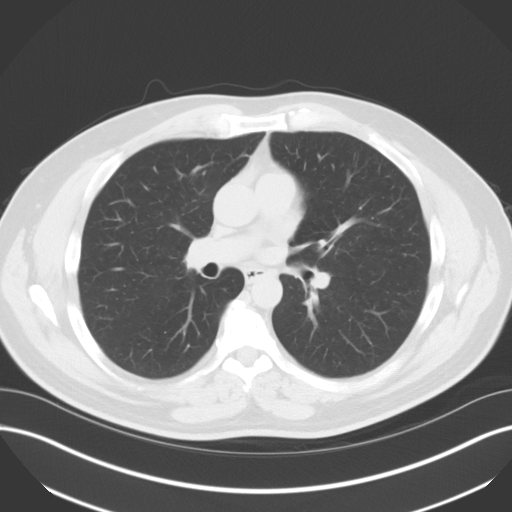
[im 45/72  lung]
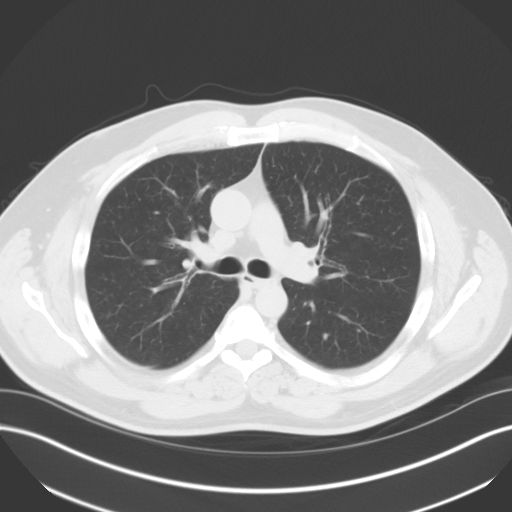
[im 50/72  mediastinal]
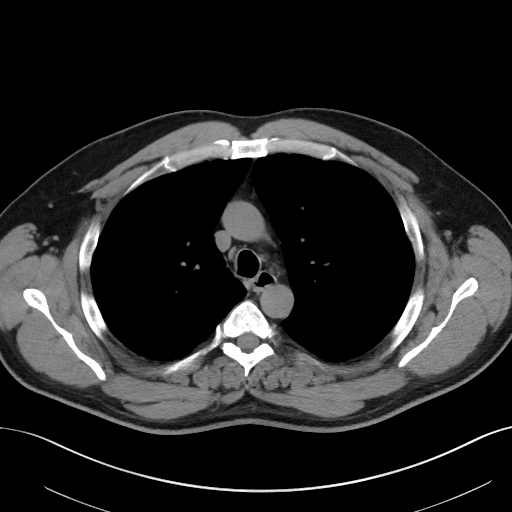
[im 50/72  lung]
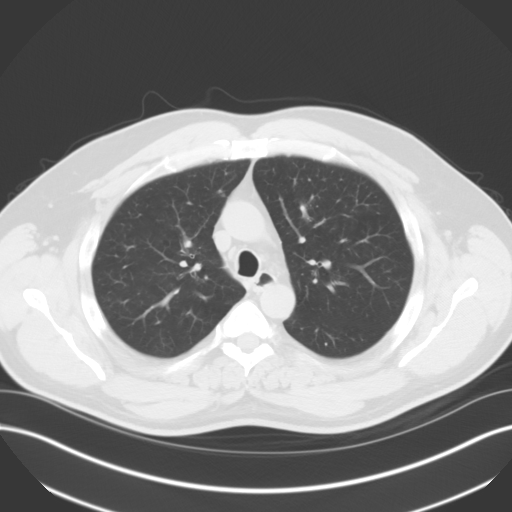
[im 56/72  lung]
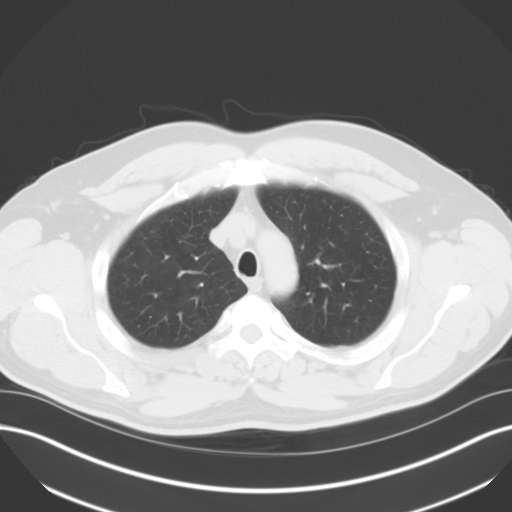
[im 61/72  lung]
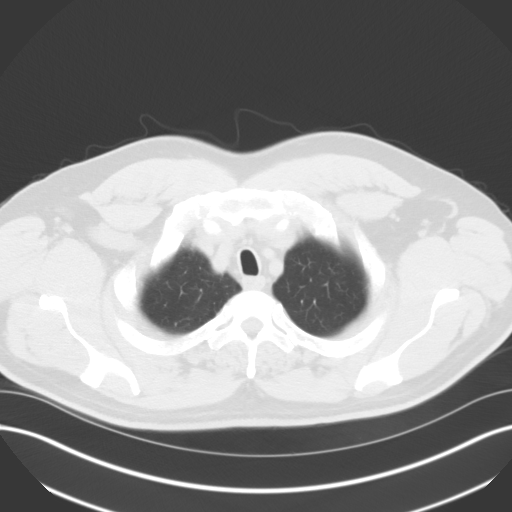
[im 66/72  lung]
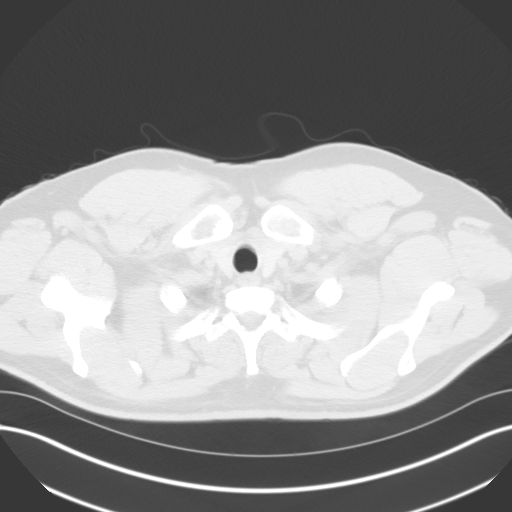

[Series 5: cor routine chest wo · coronal · 0.70mm/px · 3 of 164 slices shown]
[im 33/164  lung]
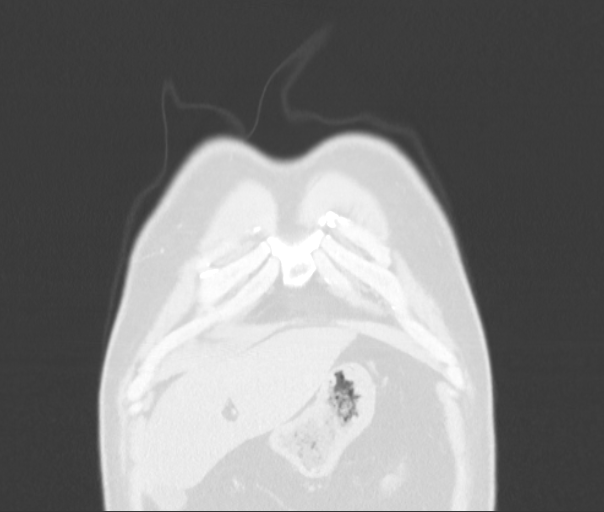
[im 66/164  lung]
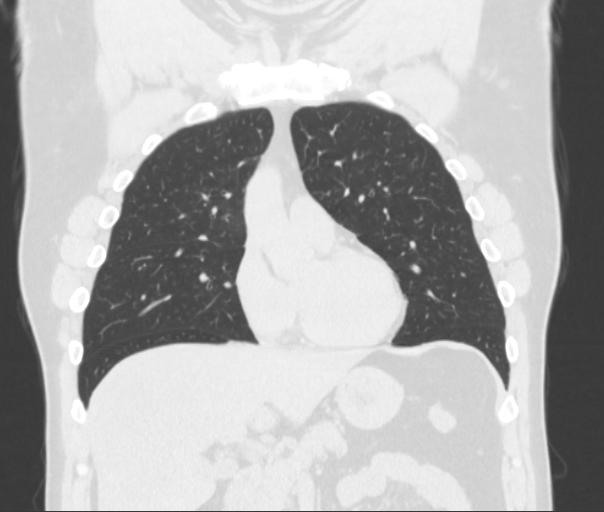
[im 98/164  lung]
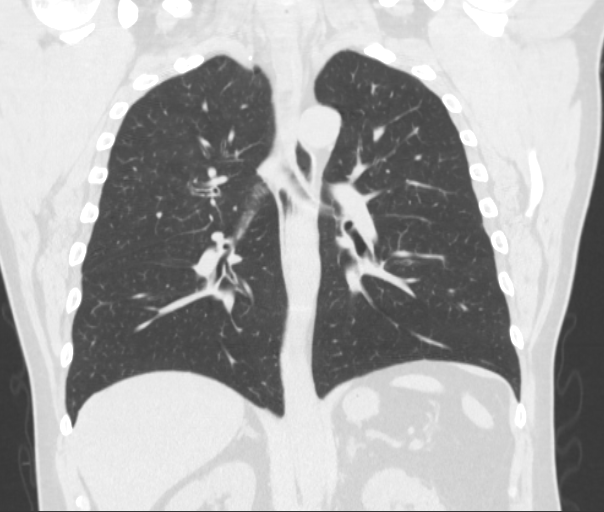

[15 of 36 positions shown; findings below may reference images not displayed]

FINDINGS: There is a stable 3 mm nodular opacity abutting the major fissure on
the left, seen on slice 32 series 4. On axial slice 36 series 4,
there is a stable 4 mm nodular opacity in the anterior segment of
the right upper lobe. On axial slice 40 series 4, there is a 3 mm
nodular opacity abutting the pleura in the posterior segment of the
right upper lobe. There is again noted a 4 mm nodular opacity in the
lateral segment of the left lower lobes seen currently on slice 49
series 4. There is a 4 mm nodular opacity in the medial segment of
the right middle lobe on slice 47 series 4, stable. No new
parenchymal nodular lung lesions are identified. There is no lung
edema or consolidation.

There is no appreciable thoracic adenopathy. The pericardium is not
thickened. No lesion is identified along the chest wall. In
particular, no lesion is seen at the site marked on the right
anteriorly were small metallic marker.

Pericardium is not thickened. There is no thoracic aortic aneurysm.
No major vessel pulmonary embolus is seen on this noncontrast
enhanced study.

In the visualized upper abdomen, there is stable mild hypertrophy of
the left adrenal. Visualized upper abdominal structures otherwise
appear normal. There is mild degenerative change in the thoracic
spine. There are no blastic or lytic bone lesions. Thyroid appears
unremarkable.
IMPRESSION: Stable small pulmonary nodular opacities. No lung edema or
consolidation. No new lesion. No adenopathy.

No lesion is seen in the area of clinical concern along the anterior
lower right chest wall. Bony structures appear intact. Note that
there is some degenerative change in the thoracic spine. There is
stable left adrenal hypertrophy, a finding of questionable clinical
significance.

## 2016-09-10 ENCOUNTER — Other Ambulatory Visit: Payer: Self-pay | Admitting: Family Medicine

## 2016-10-08 ENCOUNTER — Ambulatory Visit: Payer: Self-pay | Admitting: Family Medicine

## 2016-10-08 ENCOUNTER — Other Ambulatory Visit: Payer: Self-pay | Admitting: Family Medicine

## 2016-10-14 ENCOUNTER — Ambulatory Visit (INDEPENDENT_AMBULATORY_CARE_PROVIDER_SITE_OTHER): Payer: BC Managed Care – PPO | Admitting: Family Medicine

## 2016-10-14 ENCOUNTER — Encounter: Payer: Self-pay | Admitting: Family Medicine

## 2016-10-14 VITALS — BP 120/70 | Temp 98.0°F | Resp 16 | Wt 215.0 lb

## 2016-10-14 DIAGNOSIS — E781 Pure hyperglyceridemia: Secondary | ICD-10-CM

## 2016-10-14 DIAGNOSIS — R739 Hyperglycemia, unspecified: Secondary | ICD-10-CM | POA: Diagnosis not present

## 2016-10-14 NOTE — Progress Notes (Signed)
Patient: Troy Cuevas Male    DOB: 1960/08/10   56 y.o.   MRN: [id number] Visit Date: 10/14/2016  Today's Provider: [name], MD   Chief Complaint  Patient presents with  . Follow-up  . Hypertension  . Hyperlipidemia  . Hyperglycemia   Subjective:    HPI   Hypertension, follow-up:  BP Readings from Last 3 Encounters:  04/06/16 132/78  04/21/15 102/62  03/21/15 (!) 158/96    He was last seen for hypertension 6 months ago.  BP at that visit was 132/78. Management since that visit includes; no changes.He reports good compliance with treatment. He is not having side effects. none He is exercising. He is adherent to low salt diet.   Outside blood pressures are not checking. He is experiencing none.  Patient denies none.   Cardiovascular risk factors include none.  Use of agents associated with hypertension: none.   Has started walking every other day in addition to active work schedule.  ----------------------------------------------------------------    Lipid/Cholesterol, Follow-up:   Last seen for this 6 months ago.  Management since that visit includes; Triglycerides and blood sugar are elevated. Need to strictly avoid sweets and starchy foods. Need to exercise 30 minute a day.  Need to schedule follow up in 6 months for sugar, triglyceride, and blood sugar check.  Is avoiding breads and reduced portion sized.  Last Lipid Panel:    Component Value Date/Time   CHOL 178 04/08/2016 0708   TRIG 335 (H) 04/08/2016 0708   HDL 31 (L) 04/08/2016 0708   CHOLHDL 5.7 (H) 04/08/2016 0708   LDLCALC 80 04/08/2016 0708    He reports good compliance with treatment. He is not having side effects. none  Wt Readings from Last 3 Encounters:  04/06/16 213 lb (96.6 kg)  04/21/15 206 lb (93.4 kg)  03/21/15 206 lb (93.4 kg)    ----------------------------------------------------------------  Hyperglycemia  From 04/06/2016-Triglycerides and blood sugar  are elevated. Need to strictly avoid sweets and starchy foods. Need to exercise 30 minute a day. Continue current medications for now. Need to schedule follow up in 6 months for sugar, triglyceride, and blood sugar check.  No Known Allergies   Current Outpatient Prescriptions:  .  fluticasone (FLONASE) 50 MCG/ACT nasal spray, PLACE 2 SPRAYS IN EACH NOSTRIL DAILY, Disp: 17 g, Rfl: 3 .  lisinopril (PRINIVIL,ZESTRIL) 10 MG tablet, TAKE 1 TABLET (10 MG TOTAL) BY MOUTH DAILY., Disp: 30 tablet, Rfl: 5 .  omeprazole (PRILOSEC) 40 MG capsule, 1 CAPSULE DR, ORAL, 30 MINUTES PRIOR TO EVENING MEAL, Disp: 30 capsule, Rfl: 11  Review of Systems  Constitutional: Negative for appetite change, chills and fever.  Respiratory: Negative for chest tightness, shortness of breath and wheezing.   Cardiovascular: Negative for chest pain and palpitations.  Gastrointestinal: Negative for abdominal pain, nausea and vomiting.    Social History  Substance Use Topics  . Smoking status: Current Some Day Smoker    Packs/day: 1.00    Years: 35.00    Types: Cigarettes    Last attempt to quit: 05/25/2012  . Smokeless tobacco: Never Used  . Alcohol use 0.0 oz/week     Comment: occasional    Objective:   BP 120/70 (BP Location: Left Arm, Patient Position: Sitting, Cuff Size: Large)   Temp 98 F (36.7 C) (Oral)   Resp 16   Wt 215 lb (97.5 kg)   BMI 31.75 kg/m     Physical Exam  General Appearance:  Alert, cooperative, no distress, obese  Eyes:    PERRL, conjunctiva/corneas clear, EOM's intact       Lungs:     Clear to auscultation bilaterally, respirations unlabored  Heart:    Regular rate and rhythm  Neurologic:   Awake, alert, oriented x 3. No apparent focal neurological           defect.           Assessment & Plan:     1. Hypertriglyceridemia  - Lipid panel  2. Hyperglycemia  - Hemoglobin A1c  He reports he is doing well diet and has increase exercise.        [name], MD    [facility]

## 2016-10-15 ENCOUNTER — Encounter: Payer: Self-pay | Admitting: Family Medicine

## 2016-10-15 ENCOUNTER — Telehealth: Payer: Self-pay

## 2016-10-15 DIAGNOSIS — R7303 Prediabetes: Secondary | ICD-10-CM | POA: Insufficient documentation

## 2016-10-15 LAB — LIPID PANEL
CHOLESTEROL TOTAL: 190 mg/dL (ref 100–199)
Chol/HDL Ratio: 4.8 ratio (ref 0.0–5.0)
HDL: 40 mg/dL (ref >39)
LDL Calculated: 121 mg/dL — ABNORMAL HIGH (ref 0–99)
Triglycerides: 147 mg/dL (ref 0–149)
VLDL CHOLESTEROL CAL: 29 mg/dL (ref 5–40)

## 2016-10-15 LAB — HEMOGLOBIN A1C
ESTIMATED AVERAGE GLUCOSE: 134 mg/dL
HEMOGLOBIN A1C: 6.3 % — AB (ref 4.8–5.6)

## 2016-10-15 NOTE — Telephone Encounter (Signed)
-----   Message from Birdie Sons, MD sent at 10/15/2016  8:08 AM EDT ----- Triglycerides better, down from 335 to 147. Average blood sugar is 134, this is close to diabetic level of 140. Continue on low starch, low sugar diet and continue regular exercise. Schedule follow up/CPE in 6 months.

## 2016-10-15 NOTE — Telephone Encounter (Signed)
Left message to call back  

## 2016-10-18 NOTE — Telephone Encounter (Signed)
Advised patient of results. Appt scheduled in 6 months for CPE.

## 2017-01-10 ENCOUNTER — Other Ambulatory Visit: Payer: Self-pay | Admitting: Family Medicine

## 2017-04-11 ENCOUNTER — Other Ambulatory Visit: Payer: Self-pay | Admitting: Family Medicine

## 2017-04-22 ENCOUNTER — Encounter: Payer: Self-pay | Admitting: Family Medicine

## 2017-05-04 NOTE — Progress Notes (Signed)
Patient: Troy Cuevas, Male    DOB: 03-Jun-1961, 56 y.o.   MRN: [id number] Visit Date: 05/04/2017  Today's Provider: [name], MD   Chief Complaint  Patient presents with  . Annual Exam  . Hypertension  . Hyperlipidemia  . Hyperglycemia   Subjective:    Annual physical exam Troy Cuevas is a 56 y.o. male who presents today for health maintenance and complete physical. He feels well. He reports exercising daily. He reports he is sleeping well.  -----------------------------------------------------------------    Hypertension, follow-up:  BP Readings from Last 3 Encounters:  10/14/16 120/70  04/06/16 132/78  04/21/15 102/62    He was last seen for hypertension 1 years ago.  BP at that visit was 132/78. Management since that visit includes; no changes.He reports good compliance with treatment. He is not having side effects.  He is exercising. He is adherent to low salt diet.   Outside blood pressures are not being checked. He is experiencing none.  Patient denies chest pain, chest pressure/discomfort, claudication, dyspnea, exertional chest pressure/discomfort, fatigue, irregular heart beat, lower extremity edema, near-syncope, orthopnea, palpitations, paroxysmal nocturnal dyspnea, syncope and tachypnea.   Cardiovascular risk factors include advanced age (older than 4 for men, 55 for women), dyslipidemia, hypertension and male gender.  Use of agents associated with hypertension: none.   ------------------------------------------------------------------------    Lipid/Cholesterol, Follow-up:   Last seen for this 7 months ago.  Management since that visit includes; labs checked, no changes.  Last Lipid Panel:    Component Value Date/Time   CHOL 190 10/14/2016 1210   TRIG 147 10/14/2016 1210   HDL 40 10/14/2016 1210   CHOLHDL 4.8 10/14/2016 1210   LDLCALC 121 (H) 10/14/2016 1210    He reports good compliance with treatment. He is not  having side effects.   Wt Readings from Last 3 Encounters:  10/14/16 215 lb (97.5 kg)  04/06/16 213 lb (96.6 kg)  04/21/15 206 lb (93.4 kg)    ------------------------------------------------------------------------    Hyperglycemia, Follow-up:   Lab Results  Component Value Date   HGBA1C 6.3 (H) 10/14/2016   GLUCOSE 115 (H) 04/08/2016   GLUCOSE 123 (H) 04/22/2015   GLUCOSE 102 (H) 03/21/2015    Last seen for for this 7 months ago.  Management since then includes advising patient to continue on low starch, low sugar diet and continue regular exercise. Current symptoms include none and have been stable.  Weight trend: fluctuating a bit Prior visit with dietician: no Current diet: well balanced Current exercise: walking  Pertinent Labs:    Component Value Date/Time   CHOL 190 10/14/2016 1210   TRIG 147 10/14/2016 1210   CHOLHDL 4.8 10/14/2016 1210   CREATININE 0.85 04/08/2016 0708    Wt Readings from Last 3 Encounters:  10/14/16 215 lb (97.5 kg)  04/06/16 213 lb (96.6 kg)  04/21/15 206 lb (93.4 kg)     Gastroesophageal reflux disease, esophagitis presence not specified From 04/06/2016-Completely controlled on omeprazole. Continue current medications.  Patient reports good compliance with treatment, good tolerance and god symptom control.   He also complains of intermittent right shoulder pain for a few months, worse when reaching up and over head. Had similar pains in the past which resolved with steroid injections. Takes occasional ibuprofen which is effective.    Review of Systems  Constitutional: Negative for appetite change, chills, fatigue and fever.  HENT: Negative for congestion, ear pain, hearing loss, nosebleeds and trouble swallowing.   Eyes:  Negative for pain and visual disturbance.  Respiratory: Negative for cough, chest tightness and shortness of breath.   Cardiovascular: Negative for chest pain, palpitations and leg swelling.  Gastrointestinal:  Negative for abdominal pain, blood in stool, constipation, diarrhea, nausea and vomiting.  Endocrine: Negative for polydipsia, polyphagia and polyuria.  Genitourinary: Negative for dysuria and flank pain.  Musculoskeletal: Negative for arthralgias, back pain, joint swelling, myalgias and neck stiffness.  Skin: Negative for color change, rash and wound.  Neurological: Negative for dizziness, tremors, seizures, speech difficulty, weakness, light-headedness and headaches.  Psychiatric/Behavioral: Negative for behavioral problems, confusion, decreased concentration, dysphoric mood and sleep disturbance. The patient is not nervous/anxious.   All other systems reviewed and are negative.   Social History      He  reports that he has been smoking cigarettes.  He has a 35.00 pack-year smoking history. he has never used smokeless tobacco. He reports that he drinks alcohol. He reports that he does not use drugs.       Social History   Socioeconomic History  . Marital status: Married    Spouse name: Not on file  . Number of children: Not on file  . Years of education: 83  . Highest education level: Not on file  Social Needs  . Financial resource strain: Not on file  . Food insecurity - worry: Not on file  . Food insecurity - inability: Not on file  . Transportation needs - medical: Not on file  . Transportation needs - non-medical: Not on file  Occupational History  . Occupation: HVAC    Comment: Employed  Tobacco Use  . Smoking status: Current Some Day Smoker    Packs/day: 1.00    Years: 35.00    Pack years: 35.00    Types: Cigarettes    Last attempt to quit: 05/25/2012    Years since quitting: 4.9  . Smokeless tobacco: Never Used  Substance and Sexual Activity  . Alcohol use: Yes    Alcohol/week: 0.0 oz    Comment: occasional   . Drug use: No  . Sexual activity: Not on file  Other Topics Concern  . Not on file  Social History Narrative  . Not on file    Past Medical History:    Diagnosis Date  . Basal cell carcinoma   . GERD (gastroesophageal reflux disease)   . History of chicken pox   . Prostate cancer [facility])      Patient Active Problem List   Diagnosis Date Noted  . Prediabetes 10/15/2016  . Hypertriglyceridemia 04/09/2016  . Hyperglycemia 04/09/2016  . Benign hypertension 04/06/2016  . Tobacco use 04/06/2016  . Elevated blood pressure 03/21/2015  . Allergic rhinitis 01/15/2015  . Basal cell carcinoma 01/15/2015  . GERD (gastroesophageal reflux disease) 01/15/2015  . Lung nodule, multiple 01/15/2015  . Skin lesion 01/15/2015  . Prostate cancer ([place]) 02/27/2014  . Abdominal pain, right upper quadrant 08/15/2012  . Pulmonary nodules 08/15/2012  . Cough 08/15/2012    Past Surgical History:  Procedure Laterality Date  . CT SCAN  07/18/2012   CT of  Abdomen; Scattered Diverticulosis of left colon  . CT Scan of chest  06/12/2012   with contrast; 3 tiny nodules in both Lungs. Recommned repeat in 6 months. Otherwise normal  . Myocardial Perfusion Scan  06/14/2012   Normal  . UPPER GI ENDOSCOPY  07/31/2012   Dr. Dionne Milo, H. Pylori neg; Normal biopsy gastric antrum    Family History  Family Status  Relation Name Status  . Mother  Alive  . Father  Alive  . Sister  Alive  . Brother  Alive  . Sister  Alive        His family history includes Cancer in his father; Diabetes in his father, mother, and sister; Hypertension in his father and mother.     No Known Allergies   Current Outpatient Medications:  .  fluticasone (FLONASE) 50 MCG/ACT nasal spray, PLACE 2 SPRAYS IN EACH NOSTRIL DAILY, Disp: 16 g, Rfl: 3 .  lisinopril (PRINIVIL,ZESTRIL) 10 MG tablet, TAKE 1 TABLET (10 MG TOTAL) BY MOUTH DAILY., Disp: 30 tablet, Rfl: 0 .  omeprazole (PRILOSEC) 40 MG capsule, 1 CAPSULE DR, ORAL, 30 MINUTES PRIOR TO EVENING MEAL, Disp: 30 capsule, Rfl: 11   Patient Care Team: Birdie Sons, MD as PCP - General (Family Medicine) Lyndon Code, MD as  Referring Physician (Radiation Oncology) Velda Shell, PA-C as Physician Assistant (Dermatology)      Objective:   Vitals: BP 132/80 (BP Location: Right Arm, Cuff Size: Large)   Pulse 89   Temp 98.7 F (37.1 C) (Oral)   Resp 16   Ht 5' 8.5" (1.74 m)   Wt 218 lb (98.9 kg)   SpO2 98% Comment: room air  BMI 32.66 kg/m    Vitals:   05/05/17 1013 05/05/17 1016  BP: 140/90 132/80  Pulse: 89   Resp: 16   Temp: 98.7 F (37.1 C)   TempSrc: Oral   SpO2: 98%   Weight: 218 lb (98.9 kg)   Height: 5' 8.5" (1.74 m)      Physical Exam   General Appearance:    Alert, cooperative, no distress, appears stated age, obese  Head:    Normocephalic, without obvious abnormality, atraumatic  Eyes:    PERRL, conjunctiva/corneas clear, EOM's intact, fundi    benign, both eyes       Ears:    Normal TM's and external ear canals, both ears  Nose:   Nares normal, septum midline, mucosa normal, no drainage   or sinus tenderness  Throat:   Lips, mucosa, and tongue normal; teeth and gums normal  Neck:   Supple, symmetrical, trachea midline, no adenopathy;       thyroid:  No enlargement/tenderness/nodules; no carotid   bruit or JVD  Back:     Symmetric, no curvature, ROM normal, no CVA tenderness  Lungs:     Clear to auscultation bilaterally, respirations unlabored  Chest wall:    No tenderness or deformity  Heart:    Regular rate and rhythm, S1 and S2 normal, no murmur, rub   or gallop  Abdomen:     Soft, non-tender, bowel sounds active all four quadrants,    no masses, no organomegaly  Genitalia:    deferred  Rectal:    deferred  Extremities:   Extremities normal, atraumatic, no cyanosis or edema  Pulses:   2+ and symmetric all extremities  Skin:   Skin color, texture, turgor normal, no rashes or lesions. Psoriatic lesions noted both axillae.   Lymph nodes:   Cervical, supraclavicular, and axillary nodes normal  Neurologic:   CNII-XII intact. Normal strength, sensation and reflexes       throughout     Depression Screen PHQ 2/9 Scores 05/05/2017  PHQ - 2 Score 0  PHQ- 9 Score 0      Assessment & Plan:     Routine Health Maintenance and Physical Exam  Exercise Activities and Dietary  recommendations Goals    None      Immunization History  Administered Date(s) Administered  . Influenza,inj,Quad PF,6+ Mos 03/21/2015, 04/07/2016  . Tdap 03/21/2015    Health Maintenance  Topic Date Due  . HIV Screening  12/12/1976  . INFLUENZA VACCINE  01/26/2017  . COLONOSCOPY  07/31/2022  . TETANUS/TDAP  03/20/2025  . Hepatitis C Screening  Completed     Discussed health benefits of physical activity, and encouraged him to engage in regular exercise appropriate for his age and condition.    --------------------------------------------------------------------  1. Annual physical exam Generally doing, need to work on losing weight as below.  - EKG 12-Lead  2. Benign hypertension Well controlled.  Continue current medications.   - EKG 12-Lead  3. Prostate cancer [facility]) Continue routine follow [facility].   4. Hypertriglyceridemia Prudent diet.  - Lipid panel - COMPLETE METABOLIC PANEL WITH GFR  5. Prediabetes  - Hemoglobin A1c  6. Smoking greater than 30 pack years  - Ambulatory Referral for Lung Cancer Scre  7. BMI 32.0-32.9,adult He stays active, walking most of the day when working. Encouraged healthy reduced calorie diet   8. Psoriasis Continue routine follow up Dermatology in [facility].    [name], MD  [facility]

## 2017-05-05 ENCOUNTER — Encounter: Payer: Self-pay | Admitting: Family Medicine

## 2017-05-05 ENCOUNTER — Ambulatory Visit (INDEPENDENT_AMBULATORY_CARE_PROVIDER_SITE_OTHER): Payer: BC Managed Care – PPO | Admitting: Family Medicine

## 2017-05-05 VITALS — BP 132/80 | HR 89 | Temp 98.7°F | Resp 16 | Ht 68.5 in | Wt 218.0 lb

## 2017-05-05 DIAGNOSIS — Z0000 Encounter for general adult medical examination without abnormal findings: Secondary | ICD-10-CM | POA: Diagnosis not present

## 2017-05-05 DIAGNOSIS — C61 Malignant neoplasm of prostate: Secondary | ICD-10-CM | POA: Diagnosis not present

## 2017-05-05 DIAGNOSIS — R7303 Prediabetes: Secondary | ICD-10-CM | POA: Diagnosis not present

## 2017-05-05 DIAGNOSIS — E781 Pure hyperglyceridemia: Secondary | ICD-10-CM | POA: Diagnosis not present

## 2017-05-05 DIAGNOSIS — I10 Essential (primary) hypertension: Secondary | ICD-10-CM

## 2017-05-05 DIAGNOSIS — F17210 Nicotine dependence, cigarettes, uncomplicated: Secondary | ICD-10-CM

## 2017-05-05 DIAGNOSIS — L409 Psoriasis, unspecified: Secondary | ICD-10-CM

## 2017-05-05 DIAGNOSIS — Z6832 Body mass index (BMI) 32.0-32.9, adult: Secondary | ICD-10-CM

## 2017-05-05 NOTE — Patient Instructions (Signed)
Call for referral to orthopedist if your shoulder does not feel better or gets worse.     Preventive Care 40-64 Years, Male Preventive care refers to lifestyle choices and visits with your health care provider that can promote health and wellness. What does preventive care include?  A yearly physical exam. This is also called an annual well check.  Dental exams once or twice a year.  Routine eye exams. Ask your health care provider how often you should have your eyes checked.  Personal lifestyle choices, including: ? Daily care of your teeth and gums. ? Regular physical activity. ? Eating a healthy diet. ? Avoiding tobacco and drug use. ? Limiting alcohol use. ? Practicing safe sex. ? Taking low-dose aspirin every day starting at age 86. What happens during an annual well check? The services and screenings done by your health care provider during your annual well check will depend on your age, overall health, lifestyle risk factors, and family history of disease. Counseling Your health care provider may ask you questions about your:  Alcohol use.  Tobacco use.  Drug use.  Emotional well-being.  Home and relationship well-being.  Sexual activity.  Eating habits.  Work and work Statistician.  Screening You may have the following tests or measurements:  Height, weight, and BMI.  Blood pressure.  Lipid and cholesterol levels. These may be checked every 5 years, or more frequently if you are over 23 years old.  Skin check.  Lung cancer screening. You may have this screening every year starting at age 32 if you have a 30-pack-year history of smoking and currently smoke or have quit within the past 15 years.  Fecal occult blood test (FOBT) of the stool. You may have this test every year starting at age 48.  Flexible sigmoidoscopy or colonoscopy. You may have a sigmoidoscopy every 5 years or a colonoscopy every 10 years starting at age 47.  Prostate cancer  screening. Recommendations will vary depending on your family history and other risks.  Hepatitis C blood test.  Hepatitis B blood test.  Sexually transmitted disease (STD) testing.  Diabetes screening. This is done by checking your blood sugar (glucose) after you have not eaten for a while (fasting). You may have this done every 1-3 years.  Discuss your test results, treatment options, and if necessary, the need for more tests with your health care provider. Vaccines Your health care provider may recommend certain vaccines, such as:  Influenza vaccine. This is recommended every year.  Tetanus, diphtheria, and acellular pertussis (Tdap, Td) vaccine. You may need a Td booster every 10 years.  Varicella vaccine. You may need this if you have not been vaccinated.  Zoster vaccine. You may need this after age 78.  Measles, mumps, and rubella (MMR) vaccine. You may need at least one dose of MMR if you were born in 1957 or later. You may also need a second dose.  Pneumococcal 13-valent conjugate (PCV13) vaccine. You may need this if you have certain conditions and have not been vaccinated.  Pneumococcal polysaccharide (PPSV23) vaccine. You may need one or two doses if you smoke cigarettes or if you have certain conditions.  Meningococcal vaccine. You may need this if you have certain conditions.  Hepatitis A vaccine. You may need this if you have certain conditions or if you travel or work in places where you may be exposed to hepatitis A.  Hepatitis B vaccine. You may need this if you have certain conditions or if you  travel or work in places where you may be exposed to hepatitis B.  Haemophilus influenzae type b (Hib) vaccine. You may need this if you have certain risk factors.  Talk to your health care provider about which screenings and vaccines you need and how often you need them. This information is not intended to replace advice given to you by your health care provider. Make  sure you discuss any questions you have with your health care provider. Document Released: 07/11/2015 Document Revised: 03/03/2016 Document Reviewed: 04/15/2015 Elsevier Interactive Patient Education  2017 [employer].

## 2017-05-06 LAB — LIPID PANEL
Cholesterol: 179 mg/dL (ref <200)
HDL: 40 mg/dL — ABNORMAL LOW (ref >40)
LDL Cholesterol (Calc): 111 mg/dL (calc) — ABNORMAL HIGH
NON-HDL CHOLESTEROL (CALC): 139 mg/dL — AB (ref <130)
Total CHOL/HDL Ratio: 4.5 (calc) (ref <5.0)
Triglycerides: 168 mg/dL — ABNORMAL HIGH (ref <150)

## 2017-05-06 LAB — COMPLETE METABOLIC PANEL WITH GFR
AG Ratio: 1.5 (calc) (ref 1.0–2.5)
ALBUMIN MSPROF: 4.1 g/dL (ref 3.6–5.1)
ALT: 28 U/L (ref 9–46)
AST: 22 U/L (ref 10–35)
Alkaline phosphatase (APISO): 59 U/L (ref 40–115)
BUN: 11 mg/dL (ref 7–25)
CALCIUM: 9 mg/dL (ref 8.6–10.3)
CHLORIDE: 103 mmol/L (ref 98–110)
CO2: 28 mmol/L (ref 20–32)
CREATININE: 0.77 mg/dL (ref 0.70–1.33)
GFR, EST AFRICAN AMERICAN: 118 mL/min/{1.73_m2} (ref >=60)
GFR, EST NON AFRICAN AMERICAN: 102 mL/min/{1.73_m2} (ref >=60)
GLUCOSE: 110 mg/dL — AB (ref 65–99)
Globulin: 2.7 g/dL (calc) (ref 1.9–3.7)
Potassium: 4.4 mmol/L (ref 3.5–5.3)
Sodium: 138 mmol/L (ref 135–146)
TOTAL PROTEIN: 6.8 g/dL (ref 6.1–8.1)
Total Bilirubin: 0.4 mg/dL (ref 0.2–1.2)

## 2017-05-06 LAB — HEMOGLOBIN A1C
EAG (MMOL/L): 7 (calc)
Hgb A1c MFr Bld: 6 % of total Hgb — ABNORMAL HIGH (ref <5.7)
Mean Plasma Glucose: 126 (calc)

## 2017-05-07 ENCOUNTER — Other Ambulatory Visit: Payer: Self-pay | Admitting: Family Medicine

## 2017-05-09 ENCOUNTER — Telehealth: Payer: Self-pay | Admitting: Family Medicine

## 2017-05-09 ENCOUNTER — Telehealth: Payer: Self-pay

## 2017-05-09 DIAGNOSIS — Z122 Encounter for screening for malignant neoplasm of respiratory organs: Secondary | ICD-10-CM

## 2017-05-09 NOTE — Telephone Encounter (Signed)
-----   Message from Birdie Sons, MD sent at 05/06/2017  7:49 AM EST ----- Average blood sugar about the same at 126 which is is prediabetic. Cholesterol stable at 179. Continue current medications.  Check labs yearly.

## 2017-05-09 NOTE — Telephone Encounter (Signed)
Please review. Thanks!  

## 2017-05-09 NOTE — Telephone Encounter (Signed)
Received referral for initial lung cancer screening scan. Contacted patient and obtained smoking history,(current, 35 pack year) as well as answering questions related to screening process. Patient denies signs of lung cancer such as weight loss or hemoptysis. Patient denies comorbidity that would prevent curative treatment if lung cancer were found. Patient is scheduled for shared decision making visit and CT scan on 05/17/17.

## 2017-05-09 NOTE — Telephone Encounter (Signed)
Pt is returning call.  CB#[phone]/MW

## 2017-05-09 NOTE — Telephone Encounter (Signed)
Advised patient of results.  

## 2017-05-12 ENCOUNTER — Other Ambulatory Visit: Payer: Self-pay | Admitting: Family Medicine

## 2017-05-16 ENCOUNTER — Encounter: Payer: Self-pay | Admitting: Nurse Practitioner

## 2017-05-17 ENCOUNTER — Inpatient Hospital Stay: Payer: BC Managed Care – PPO | Attending: Nurse Practitioner | Admitting: Nurse Practitioner

## 2017-05-17 ENCOUNTER — Ambulatory Visit
Admission: RE | Admit: 2017-05-17 | Discharge: 2017-05-17 | Disposition: A | Discharge status: Home / Self Care | Payer: BC Managed Care – PPO | Source: Ambulatory Visit | Attending: Nurse Practitioner | Admitting: Nurse Practitioner

## 2017-05-17 DIAGNOSIS — Z122 Encounter for screening for malignant neoplasm of respiratory organs: Secondary | ICD-10-CM | POA: Diagnosis present

## 2017-05-17 DIAGNOSIS — F17210 Nicotine dependence, cigarettes, uncomplicated: Secondary | ICD-10-CM

## 2017-05-17 DIAGNOSIS — J439 Emphysema, unspecified: Secondary | ICD-10-CM | POA: Diagnosis not present

## 2017-05-17 DIAGNOSIS — Z87891 Personal history of nicotine dependence: Secondary | ICD-10-CM | POA: Insufficient documentation

## 2017-05-17 NOTE — Progress Notes (Signed)
In accordance with CMS guidelines, patient has met eligibility criteria including age, absence of signs or symptoms of lung cancer.  Social History   Tobacco Use  . Smoking status: Current Every Day Smoker    Packs/day: 1.00    Years: 35.00    Pack years: 35.00    Types: Cigarettes    Last attempt to quit: 05/25/2012    Years since quitting: 4.9  . Smokeless tobacco: Never Used  Substance Use Topics  . Alcohol use: Yes    Alcohol/week: 0.0 oz    Comment: occasional   . Drug use: No     A shared decision-making session was conducted prior to the performance of CT scan. This includes one or more decision aids, includes benefits and harms of screening, follow-up diagnostic testing, over-diagnosis, false positive rate, and total radiation exposure.  Counseling on the importance of adherence to annual lung cancer LDCT screening, impact of co-morbidities, and ability or willingness to undergo diagnosis and treatment is imperative for compliance of the program.  Counseling on the importance of continued smoking cessation for former smokers; the importance of smoking cessation for current smokers, and information about tobacco cessation interventions have been given to patient including [place] and [address] quit [place] programs.  Written order for lung cancer screening with LDCT has been given to the patient and any and all questions have been answered to the best of my abilities.   Yearly follow up will be coordinated by Burgess Estelle, Thoracic Navigator.  Beckey Rutter, DNP, AGNP-C 05/17/17 4:39 PM

## 2017-05-18 ENCOUNTER — Encounter: Payer: Self-pay | Admitting: *Deleted

## 2017-05-20 ENCOUNTER — Encounter: Payer: Self-pay | Admitting: Family Medicine

## 2017-05-20 DIAGNOSIS — J439 Emphysema, unspecified: Secondary | ICD-10-CM | POA: Insufficient documentation

## 2017-07-06 ENCOUNTER — Telehealth: Payer: Self-pay | Admitting: Family Medicine

## 2017-07-06 DIAGNOSIS — M25511 Pain in right shoulder: Principal | ICD-10-CM

## 2017-07-06 DIAGNOSIS — G8929 Other chronic pain: Secondary | ICD-10-CM

## 2017-07-06 NOTE — Telephone Encounter (Signed)
Pt is requesting referral to see specialist for right shoulder pain.He states he spoke with you about this at last office vist

## 2017-09-05 ENCOUNTER — Other Ambulatory Visit: Payer: Self-pay | Admitting: Family Medicine

## 2018-01-03 ENCOUNTER — Other Ambulatory Visit: Payer: Self-pay | Admitting: Family Medicine

## 2018-05-06 ENCOUNTER — Telehealth: Payer: Self-pay

## 2018-05-06 NOTE — Telephone Encounter (Signed)
Call pt regarding lung screening. Left message for pt to return call.  

## 2018-05-09 ENCOUNTER — Other Ambulatory Visit: Payer: Self-pay | Admitting: Family Medicine

## 2018-05-13 ENCOUNTER — Telehealth: Payer: Self-pay

## 2018-05-13 NOTE — Telephone Encounter (Signed)
Call pt regarding lung screening. Left message for pt to return call.  

## 2018-05-17 ENCOUNTER — Telehealth: Payer: Self-pay | Admitting: *Deleted

## 2018-05-17 NOTE — Telephone Encounter (Signed)
Left message for patient to notify them that it is time to schedule annual low dose lung cancer screening CT scan. Instructed patient to call back to verify information prior to the scan being scheduled.  

## 2018-05-29 ENCOUNTER — Encounter: Payer: Self-pay | Admitting: *Deleted

## 2018-06-02 ENCOUNTER — Telehealth: Payer: Self-pay | Admitting: *Deleted

## 2018-06-02 DIAGNOSIS — Z122 Encounter for screening for malignant neoplasm of respiratory organs: Secondary | ICD-10-CM

## 2018-06-02 DIAGNOSIS — Z87891 Personal history of nicotine dependence: Secondary | ICD-10-CM

## 2018-06-02 NOTE — Telephone Encounter (Signed)
Patient has been notified that annual lung cancer screening low dose CT scan is due currently or will be in near future. Confirmed that patient is within the age range of 55-77, and asymptomatic, (no signs or symptoms of lung cancer). Patient denies illness that would prevent curative treatment for lung cancer if found. Verified smoking history, (current, 36 pack year). The shared decision making visit was done 05/17/17. Patient is agreeable for CT scan being scheduled.

## 2018-06-06 ENCOUNTER — Ambulatory Visit (INDEPENDENT_AMBULATORY_CARE_PROVIDER_SITE_OTHER): Payer: BC Managed Care – PPO | Admitting: Family Medicine

## 2018-06-06 ENCOUNTER — Encounter: Payer: Self-pay | Admitting: Family Medicine

## 2018-06-06 VITALS — BP 136/84 | HR 76 | Temp 98.1°F | Resp 16 | Ht 69.0 in | Wt 218.0 lb

## 2018-06-06 DIAGNOSIS — F17210 Nicotine dependence, cigarettes, uncomplicated: Secondary | ICD-10-CM

## 2018-06-06 DIAGNOSIS — R7303 Prediabetes: Secondary | ICD-10-CM | POA: Diagnosis not present

## 2018-06-06 DIAGNOSIS — J439 Emphysema, unspecified: Secondary | ICD-10-CM

## 2018-06-06 DIAGNOSIS — I10 Essential (primary) hypertension: Secondary | ICD-10-CM

## 2018-06-06 DIAGNOSIS — E781 Pure hyperglyceridemia: Secondary | ICD-10-CM | POA: Diagnosis not present

## 2018-06-06 DIAGNOSIS — Z0000 Encounter for general adult medical examination without abnormal findings: Secondary | ICD-10-CM | POA: Diagnosis not present

## 2018-06-06 NOTE — Progress Notes (Signed)
Patient: Troy Cuevas, Male    DOB: 09-26-1960, 57 y.o.   MRN: [id number] Visit Date: 06/06/2018  Today's Provider: [name], MD   Chief Complaint  Patient presents with  . Annual Exam   Subjective:    Annual physical exam Troy Cuevas is a 57 y.o. male who presents today for health maintenance and complete physical. He feels well. He reports exercising regularly. He reports he is sleeping well.  Quit smoking cigarettes, still smokes Cigar occasionally. No dyspnea or cough.    Had PSA checked at [facility] in September and was 0.10.  -----------------------------------------------------------------   Review of Systems  Constitutional: Negative.   HENT: Negative.   Eyes: Negative.   Cardiovascular: Negative.   Gastrointestinal: Negative.   Endocrine: Negative.   Genitourinary: Negative.   Musculoskeletal: Positive for neck pain. Negative for arthralgias, back pain, gait problem, joint swelling, myalgias and neck stiffness.  Skin: Negative.   Allergic/Immunologic: Negative.   Neurological: Negative.   Hematological: Negative.   Psychiatric/Behavioral: Negative.     Social History      He  reports that he has been smoking cigarettes. He has a 35.00 pack-year smoking history. He has never used smokeless tobacco. He reports that he drinks alcohol. He reports that he does not use drugs.       Social History   Socioeconomic History  . Marital status: Married    Spouse name: Not on file  . Number of children: Not on file  . Years of education: 73  . Highest education level: Not on file  Occupational History  . Occupation: HVAC    Comment: Employed  Social Needs  . Financial resource strain: Not on file  . Food insecurity:    Worry: Not on file    Inability: Not on file  . Transportation needs:    Medical: Not on file    Non-medical: Not on file  Tobacco Use  . Smoking status: Current Every Day Smoker    Packs/day: 1.00    Years: 35.00    Pack  years: 35.00    Types: Cigarettes    Last attempt to quit: 05/25/2012    Years since quitting: 6.0  . Smokeless tobacco: Never Used  Substance and Sexual Activity  . Alcohol use: Yes    Alcohol/week: 0.0 standard drinks    Comment: occasional   . Drug use: No  . Sexual activity: Not on file  Lifestyle  . Physical activity:    Days per week: Not on file    Minutes per session: Not on file  . Stress: Not on file  Relationships  . Social connections:    Talks on phone: Not on file    Gets together: Not on file    Attends religious service: Not on file    Active member of club or organization: Not on file    Attends meetings of clubs or organizations: Not on file    Relationship status: Not on file  Other Topics Concern  . Not on file  Social History Narrative  . Not on file    Past Medical History:  Diagnosis Date  . Basal cell carcinoma   . GERD (gastroesophageal reflux disease)   . History of chicken pox   . Prostate cancer ([place])   . Psoriasis      Patient Active Problem List   Diagnosis Date Noted  . Emphysema lung ([place]) 05/20/2017  . Psoriasis 05/05/2017  . Prediabetes 10/15/2016  .  Hypertriglyceridemia 04/09/2016  . Benign hypertension 04/06/2016  . Smoking greater than 30 pack years 04/06/2016  . Elevated blood pressure 03/21/2015  . Allergic rhinitis 01/15/2015  . Basal cell carcinoma 01/15/2015  . GERD (gastroesophageal reflux disease) 01/15/2015  . Skin lesion 01/15/2015  . Prostate cancer ([place]) 02/27/2014  . Abdominal pain, right upper quadrant 08/15/2012  . Pulmonary nodules 08/15/2012    Past Surgical History:  Procedure Laterality Date  . CT SCAN  07/18/2012   CT of  Abdomen; Scattered Diverticulosis of left colon  . CT Scan of chest  06/12/2012   with contrast; 3 tiny nodules in both Lungs. Recommned repeat in 6 months. Otherwise normal  . Myocardial Perfusion Scan  06/14/2012   Normal  . UPPER GI ENDOSCOPY  07/31/2012   Dr. Dionne Milo, H.  Pylori neg; Normal biopsy gastric antrum    Family History        Family Status  Relation Name Status  . Mother  Alive  . Father  Alive  . Sister  Deceased  . Brother  Alive  . Sister  Alive        His family history includes Cancer in his father; Diabetes in his father, mother, and sister; Hypertension in his father and mother.      No Known Allergies   Current Outpatient Medications:  .  fluticasone (FLONASE) 50 MCG/ACT nasal spray, PLACE 2 SPRAYS IN EACH NOSTRIL DAILY, Disp: 16 g, Rfl: 4 .  lisinopril (PRINIVIL,ZESTRIL) 10 MG tablet, TAKE 1 TABLET BY MOUTH EVERY DAY, Disp: 90 tablet, Rfl: 0 .  omeprazole (PRILOSEC) 40 MG capsule, 1 CAPSULE DR, ORAL, 30 MINUTES PRIOR TO EVENING MEAL, Disp: 90 capsule, Rfl: 0 .  desonide (DESOWEN) 0.05 % cream, APPLY TO AFFECTED AREAS TWICE DAILY FOR A COUPLE WEEKS AT A TIME. NOT FOR LONG TERM USE., Disp: , Rfl: 1 .  ketoconazole (NIZORAL) 2 % cream, MIX WITH DESONIDE GEL TO APPLY TO AFFECTED AREA IN GROIN AS NEEDED, Disp: , Rfl: 5 .  TALTZ 53 MG/ML SOAJ, , Disp: , Rfl:    Patient Care Team: Birdie Sons, MD as PCP - General (Family Medicine) Lyndon Code, MD as Referring Physician (Radiation Oncology) Velda Shell, PA-C as Physician Assistant (Dermatology)      Objective:   Vitals: BP 136/84 (BP Location: Right Arm, Patient Position: Sitting, Cuff Size: Large)   Pulse 76   Temp 98.1 F (36.7 C) (Oral)   Resp 16   Ht 5\' 9"  (1.753 m)   Wt 218 lb (98.9 kg)   BMI 32.19 kg/m    Vitals:   06/06/18 1404  BP: 136/84  Pulse: 76  Resp: 16  Temp: 98.1 F (36.7 C)  TempSrc: Oral  Weight: 218 lb (98.9 kg)  Height: 5\' 9"  (1.753 m)     Physical Exam   General Appearance:    Alert, cooperative, no distress, appears stated age, overweight  Head:    Normocephalic, without obvious abnormality, atraumatic  Eyes:    PERRL, conjunctiva/corneas clear, EOM's intact, fundi    benign, both eyes       Ears:    Normal TM's and external ear  canals, both ears  Nose:   Nares normal, septum midline, mucosa normal, no drainage   or sinus tenderness  Throat:   Lips, mucosa, and tongue normal; teeth and gums normal  Neck:   Supple, symmetrical, trachea midline, no adenopathy;       thyroid:  No enlargement/tenderness/nodules; no carotid  bruit or JVD  Back:     Symmetric, no curvature, ROM normal, no CVA tenderness  Lungs:     Clear to auscultation bilaterally, respirations unlabored  Chest wall:    No tenderness or deformity  Heart:    Regular rate and rhythm, S1 and S2 normal, no murmur, rub   or gallop  Abdomen:     Soft, non-tender, bowel sounds active all four quadrants,    no masses, no organomegaly  Genitalia:    deferred  Rectal:    deferred  Extremities:   Extremities normal, atraumatic, no cyanosis or edema  Pulses:   2+ and symmetric all extremities  Skin:   Skin color, texture, turgor normal, no rashes or lesions  Lymph nodes:   Cervical, supraclavicular, and axillary nodes normal  Neurologic:   CNII-XII intact. Normal strength, sensation and reflexes      throughout    Depression Screen PHQ 2/9 Scores 06/06/2018 05/05/2017  PHQ - 2 Score 0 0  PHQ- 9 Score 1 0      Assessment & Plan:     Routine Health Maintenance and Physical Exam  Exercise Activities and Dietary recommendations Goals   None     Immunization History  Administered Date(s) Administered  . Hepatitis B, adult 09/15/2017, 10/17/2017  . Influenza,inj,Quad PF,6+ Mos 03/21/2015, 04/07/2016  . Influenza-Unspecified 04/14/2017  . Tdap 03/21/2015  . Zoster Recombinat (Shingrix) 09/15/2017    Health Maintenance  Topic Date Due  . HIV Screening  12/12/1976  . COLONOSCOPY  07/31/2022  . TETANUS/TDAP  03/20/2025  . INFLUENZA VACCINE  Completed  . Hepatitis C Screening  Completed     Discussed health benefits of physical activity, and encouraged him to engage in regular exercise appropriate for his age and condition.      --------------------------------------------------------------------  1. Annual physical exam  - Comprehensive metabolic panel - Lipid panel - Hemoglobin A1c  2. Pulmonary emphysema, unspecified emphysema type ([place]) Is working on quitting smoking. States breathing has improved since he quit cigarettes.   3. Prediabetes  - Hemoglobin A1c  4. Hypertriglyceridemia  - Comprehensive metabolic panel - Lipid panel  5. Smoking greater than 30 pack years Scheduled for LDCT  6. Essential hypertension Well controlled.  Continue current medications.  - EKG 12-Lead   [name], MD  [facility]

## 2018-06-07 LAB — COMPREHENSIVE METABOLIC PANEL
ALT: 31 IU/L (ref 0–44)
AST: 23 IU/L (ref 0–40)
Albumin/Globulin Ratio: 1.9 (ref 1.2–2.2)
Albumin: 4.3 g/dL (ref 3.5–5.5)
Alkaline Phosphatase: 70 IU/L (ref 39–117)
BUN/Creatinine Ratio: 10 (ref 9–20)
BUN: 11 mg/dL (ref 6–24)
Bilirubin Total: 0.2 mg/dL (ref 0.0–1.2)
CO2: 24 mmol/L (ref 20–29)
Calcium: 9.6 mg/dL (ref 8.7–10.2)
Chloride: 99 mmol/L (ref 96–106)
Creatinine, Ser: 1.06 mg/dL (ref 0.76–1.27)
GFR calc Af Amer: 90 mL/min/{1.73_m2} (ref >59)
GFR calc non Af Amer: 78 mL/min/{1.73_m2} (ref >59)
Globulin, Total: 2.3 g/dL (ref 1.5–4.5)
Glucose: 93 mg/dL (ref 65–99)
Potassium: 4.3 mmol/L (ref 3.5–5.2)
Sodium: 137 mmol/L (ref 134–144)
Total Protein: 6.6 g/dL (ref 6.0–8.5)

## 2018-06-07 LAB — HEMOGLOBIN A1C
ESTIMATED AVERAGE GLUCOSE: 134 mg/dL
Hgb A1c MFr Bld: 6.3 % — ABNORMAL HIGH (ref 4.8–5.6)

## 2018-06-07 LAB — LIPID PANEL
CHOL/HDL RATIO: 5.1 ratio — AB (ref 0.0–5.0)
Cholesterol, Total: 179 mg/dL (ref 100–199)
HDL: 35 mg/dL — ABNORMAL LOW (ref >39)
LDL Calculated: 95 mg/dL (ref 0–99)
Triglycerides: 244 mg/dL — ABNORMAL HIGH (ref 0–149)
VLDL Cholesterol Cal: 49 mg/dL — ABNORMAL HIGH (ref 5–40)

## 2018-06-08 ENCOUNTER — Telehealth: Payer: Self-pay

## 2018-06-08 ENCOUNTER — Ambulatory Visit
Admission: RE | Admit: 2018-06-08 | Discharge: 2018-06-08 | Disposition: A | Discharge status: Home / Self Care | Payer: BC Managed Care – PPO | Source: Ambulatory Visit | Attending: Nurse Practitioner | Admitting: Nurse Practitioner

## 2018-06-08 DIAGNOSIS — Z87891 Personal history of nicotine dependence: Secondary | ICD-10-CM

## 2018-06-08 DIAGNOSIS — Z122 Encounter for screening for malignant neoplasm of respiratory organs: Secondary | ICD-10-CM | POA: Diagnosis not present

## 2018-06-08 NOTE — Telephone Encounter (Signed)
Tried calling patient. Left message to call back. 

## 2018-06-08 NOTE — Telephone Encounter (Signed)
Spoke to pt and advised lab results and scheduled his 6 month fup for 12/08/2018 @ 8am

## 2018-06-08 NOTE — Telephone Encounter (Signed)
-----   Message from Birdie Sons, MD sent at 06/07/2018  7:42 AM EST ----- Triglycerides a little high. Cholesterol is ok. Average blood sugar is 134 which is getting near diabetic range. Need to avoid all sweets and avoid starchy foods. Exercise a little more. Need to schedule follow up in 6 months to check A1c. If not coming down may need medications for sugar.

## 2018-06-12 ENCOUNTER — Encounter: Payer: Self-pay | Admitting: *Deleted

## 2018-07-03 ENCOUNTER — Other Ambulatory Visit: Payer: Self-pay | Admitting: Family Medicine

## 2018-07-05 ENCOUNTER — Other Ambulatory Visit: Payer: Self-pay | Admitting: Family Medicine

## 2018-10-27 ENCOUNTER — Other Ambulatory Visit: Payer: Self-pay | Admitting: Family Medicine

## 2018-12-08 ENCOUNTER — Encounter: Payer: Self-pay | Admitting: Family Medicine

## 2018-12-08 ENCOUNTER — Other Ambulatory Visit: Payer: Self-pay

## 2018-12-08 ENCOUNTER — Ambulatory Visit: Payer: BC Managed Care – PPO | Admitting: Family Medicine

## 2018-12-08 VITALS — BP 136/86 | HR 72 | Temp 98.3°F | Resp 16 | Wt 215.0 lb

## 2018-12-08 DIAGNOSIS — I10 Essential (primary) hypertension: Secondary | ICD-10-CM | POA: Diagnosis not present

## 2018-12-08 DIAGNOSIS — R05 Cough: Secondary | ICD-10-CM

## 2018-12-08 DIAGNOSIS — R053 Chronic cough: Secondary | ICD-10-CM

## 2018-12-08 DIAGNOSIS — R7303 Prediabetes: Secondary | ICD-10-CM | POA: Diagnosis not present

## 2018-12-08 DIAGNOSIS — J439 Emphysema, unspecified: Secondary | ICD-10-CM

## 2018-12-08 DIAGNOSIS — F17210 Nicotine dependence, cigarettes, uncomplicated: Secondary | ICD-10-CM | POA: Diagnosis not present

## 2018-12-08 LAB — POCT GLYCOSYLATED HEMOGLOBIN (HGB A1C): Hemoglobin A1C: 6.3 % — AB (ref 4.0–5.6)

## 2018-12-08 NOTE — Patient Instructions (Addendum)
.   Please review the attached list of medications and notify my office if there are any errors.   . I think you try Zyban (bupropion) to help you stop smoking. Call my office if you decide to try this medication.

## 2018-12-08 NOTE — Progress Notes (Addendum)
Patient: Troy Cuevas Male    DOB: 23-Jun-1961   58 y.o.   MRN: [id number] Visit Date: 12/08/2018  Today's Provider: [name], MD   Chief Complaint  Patient presents with  . Hyperglycemia  . Hypertension   Subjective:     HPI    Prediabetes, Follow-up:   Lab Results  Component Value Date   HGBA1C 6.3 (H) 06/06/2018   HGBA1C 6.0 (H) 05/05/2017   HGBA1C 6.3 (H) 10/14/2016   GLUCOSE 93 06/06/2018   GLUCOSE 110 (H) 05/05/2017   GLUCOSE 115 (H) 04/08/2016    Last seen for for this6 months ago.  Management since that visit includes working on lifestyle changes. Current symptoms include none and have been stable.  Weight trend: stable Prior visit with dietician: no Current diet: in general, a "healthy" diet   Current exercise: yard work  Pertinent Labs:    Component Value Date/Time   CHOL 179 06/06/2018 1451   TRIG 244 (H) 06/06/2018 1451   CHOLHDL 5.1 (H) 06/06/2018 1451   CHOLHDL 4.5 05/05/2017 1052   CREATININE 1.06 06/06/2018 1451   CREATININE 0.77 05/05/2017 1052    Wt Readings from Last 3 Encounters:  12/08/18 215 lb (97.5 kg)  06/08/18 217 lb (98.4 kg)  06/06/18 218 lb (98.9 kg)    Hypertension, follow-up:  BP Readings from Last 3 Encounters:  12/08/18 136/86  06/06/18 136/84  05/05/17 132/80    He was last seen for hypertension 6 months ago.  BP at that visit was 136/84. Management since that visit includes no changes. He reports excellent compliance with treatment. He is not having side effects.  He is exercising. He is adherent to low salt diet.   Outside blood pressures are not being checked. He is experiencing none.  Patient denies chest pain, fatigue, lower extremity edema and palpitations.   Cardiovascular risk factors include advanced age (older than 54 for men, 91 for women), hypertension, male gender and obesity (BMI >= 30 kg/m2).  Use of agents associated with hypertension: none.     Weight trend: stable Wt  Readings from Last 3 Encounters:  12/08/18 215 lb (97.5 kg)  06/08/18 217 lb (98.4 kg)  06/06/18 218 lb (98.9 kg)    Current diet: in general, a "healthy" diet    ------------------------------------------------------------------------  He does report long history of coughing. Is mild, dry and intermittent no worse now than in the last few years. He has been trying to quit smoking. Has been successful for a few weeks at a time, but unable to quit for good.   No Known Allergies   Current Outpatient Medications:  .  fluticasone (FLONASE) 50 MCG/ACT nasal spray, PLACE 2 SPRAYS IN EACH NOSTRIL DAILY, Disp: 48 g, Rfl: 2 .  lisinopril (PRINIVIL,ZESTRIL) 10 MG tablet, TAKE 1 TABLET BY MOUTH EVERY DAY, Disp: 90 tablet, Rfl: 2 .  omeprazole (PRILOSEC) 40 MG capsule, 1 CAPSULE DR, ORAL, 30 MINUTES PRIOR TO EVENING MEAL, Disp: 90 capsule, Rfl: 4 .  TALTZ 80 MG/ML SOAJ, , Disp: , Rfl:  .  desonide (DESOWEN) 0.05 % cream, APPLY TO AFFECTED AREAS TWICE DAILY FOR A COUPLE WEEKS AT A TIME. NOT FOR LONG TERM USE., Disp: , Rfl: 1 .  ketoconazole (NIZORAL) 2 % cream, MIX WITH DESONIDE GEL TO APPLY TO AFFECTED AREA IN GROIN AS NEEDED, Disp: , Rfl: 5  Review of Systems  Constitutional: Negative.   Respiratory: Positive for cough (Pt is concern lisinopril is causing his  cough.). Negative for apnea, choking, chest tightness, shortness of breath, wheezing and stridor.   Cardiovascular: Negative.   Gastrointestinal: Negative.   Musculoskeletal: Negative.   Neurological: Negative for dizziness, light-headedness and headaches.    Social History   Tobacco Use  . Smoking status: Current Every Day Smoker    Packs/day: 1.00    Years: 35.00    Pack years: 35.00    Types: Cigarettes    Last attempt to quit: 05/25/2012    Years since quitting: 6.5  . Smokeless tobacco: Never Used  Substance Use Topics  . Alcohol use: Yes    Alcohol/week: 0.0 standard drinks    Comment: occasional       Objective:    BP 136/86 (BP Location: Right Arm, Patient Position: Sitting, Cuff Size: Large)   Pulse 72   Temp 98.3 F (36.8 C) (Oral)   Resp 16   Wt 215 lb (97.5 kg)   BMI 31.75 kg/m  Vitals:   12/08/18 0820  BP: 136/86  Pulse: 72  Resp: 16  Temp: 98.3 F (36.8 C)  TempSrc: Oral  Weight: 215 lb (97.5 kg)     Physical Exam   General Appearance:    Alert, cooperative, no distress  Eyes:    PERRL, conjunctiva/corneas clear, EOM's intact       Lungs:     Occasional expiratory wheezes, respirations unlabored, mild diffuse expiratory wheezes.   Heart:    Regular rate and rhythm  Neurologic:   Awake, alert, oriented x 3. No apparent focal neurological           defect.           Assessment & Plan    1. Essential hypertension Well controlled.  Continue current medications.  Cough is likely to be primarily due to smoking and COPD  2. Prediabetes Well controlled.  Check a1c q18mos  - POCT glycosylated hemoglobin (Hb A1C)  3. Pulmonary emphysema, unspecified emphysema type ([place]) Counseled on importance of smoking cessation and offered prescription medication which he declined. Continue annual LDCT  4. Smoking greater than 30 pack years   5. Chronic cough      The entirety of the information documented in the History of Present Illness, Review of Systems and Physical Exam were personally obtained by me. Portions of this information were initially documented by Ashley Royalty, CMA and reviewed by me for thoroughness and accuracy.   [name], MD  [facility]

## 2019-01-23 ENCOUNTER — Other Ambulatory Visit: Payer: Self-pay | Admitting: Family Medicine

## 2019-03-22 ENCOUNTER — Other Ambulatory Visit: Payer: Self-pay | Admitting: Family Medicine

## 2019-06-07 ENCOUNTER — Telehealth: Payer: Self-pay

## 2019-06-07 NOTE — Telephone Encounter (Signed)
Left message for pt to inform him that it is time for his annual lung cancer screening. Instructed pt to call back to confirm information prior to CT scan being scheduled.

## 2019-06-08 ENCOUNTER — Encounter: Payer: BC Managed Care – PPO | Admitting: Family Medicine

## 2019-06-11 NOTE — Progress Notes (Signed)
Patient: Troy Cuevas, Male    DOB: [birth year], 58 y.o.   MRN: [id number] Visit Date: 06/12/2019  Today's Provider: [name], MD   Chief Complaint  Patient presents with  . Annual Exam   Subjective:    Annual physical exam LOAY Troy Cuevas is a 57 y.o. male who presents today for health maintenance and complete physical. He feels fairly well, patient would like to address today changing blood pressure medication. Patient reports that he discontinued Lisinopril 2 months ago due to cough he had, patient states that once he stopped medication cough resolved. He reports exercising by working out on tredmill 2x a week. He reports he is sleeping well.  Last Reported Colonoscopy- 07/31/12  ----------------------------------------------------------------- Had been on lisinopril for htn, but he states was having very persistent coughing and stopped taking it about a month ago. Cough has since resolved.   He also working on smoking cessation and now just smoking a cigarette every once in awhile.   Review of Systems  Constitutional: Negative for chills, diaphoresis and fever.  HENT: Negative for congestion, ear discharge, ear pain, hearing loss, nosebleeds, sore throat and tinnitus.   Eyes: Negative for photophobia, pain, discharge and redness.  Respiratory: Negative for cough, shortness of breath, wheezing and stridor.   Cardiovascular: Negative for chest pain, palpitations and leg swelling.  Gastrointestinal: Negative for abdominal pain, blood in stool, constipation, diarrhea, nausea and vomiting.  Endocrine: Negative for polydipsia.  Genitourinary: Negative for dysuria, flank pain, frequency, hematuria and urgency.  Musculoskeletal: Negative for back pain, myalgias and neck pain.  Skin: Negative for rash.  Allergic/Immunologic: Negative for environmental allergies.  Neurological: Negative for dizziness, tremors, seizures, weakness and headaches.  Hematological: Does not  bruise/bleed easily.  Psychiatric/Behavioral: Negative for hallucinations and suicidal ideas. The patient is not nervous/anxious.   All other systems reviewed and are negative.   Social History He  reports that he has been smoking cigarettes. He has a 35.00 pack-year smoking history. He has never used smokeless tobacco. He reports current alcohol use. He reports that he does not use drugs. Social History   Socioeconomic History  . Marital status: Married    Spouse name: Not on file  . Number of children: Not on file  . Years of education: 1  . Highest education level: Not on file  Occupational History  . Occupation: HVAC    Comment: Employed  Tobacco Use  . Smoking status: Current Every Day Smoker    Packs/day: 1.00    Years: 35.00    Pack years: 35.00    Types: Cigarettes    Last attempt to quit: 05/25/2012    Years since quitting: 7.0  . Smokeless tobacco: Never Used  Substance and Sexual Activity  . Alcohol use: Yes    Alcohol/week: 0.0 standard drinks    Comment: occasional   . Drug use: No  . Sexual activity: Not on file  Other Topics Concern  . Not on file  Social History Narrative  . Not on file   Social Determinants of Health   Financial Resource Strain:   . Difficulty of Paying Living Expenses: Not on file  Food Insecurity:   . Worried About Charity fundraiser in the Last Year: Not on file  . Ran Out of Food in the Last Year: Not on file  Transportation Needs:   . Lack of Transportation (Medical): Not on file  . Lack of Transportation (Non-Medical): Not on file  Physical  Activity:   . Days of Exercise per Week: Not on file  . Minutes of Exercise per Session: Not on file  Stress:   . Feeling of Stress : Not on file  Social Connections:   . Frequency of Communication with Friends and Family: Not on file  . Frequency of Social Gatherings with Friends and Family: Not on file  . Attends Religious Services: Not on file  . Active Member of Clubs or  Organizations: Not on file  . Attends Archivist Meetings: Not on file  . Marital Status: Not on file    Patient Active Problem List   Diagnosis Date Noted  . Chronic cough 12/08/2018  . Emphysema lung ([place]) 05/20/2017  . Psoriasis 05/05/2017  . Prediabetes 10/15/2016  . Hypertriglyceridemia 04/09/2016  . Essential hypertension 04/06/2016  . Smoking greater than 30 pack years 04/06/2016  . Allergic rhinitis 01/15/2015  . Basal cell carcinoma 01/15/2015  . GERD (gastroesophageal reflux disease) 01/15/2015  . Skin lesion 01/15/2015  . Prostate cancer ([place]) 02/27/2014  . Abdominal pain, right upper quadrant 08/15/2012  . Pulmonary nodules 08/15/2012    Past Surgical History:  Procedure Laterality Date  . CT SCAN  07/18/2012   CT of  Abdomen; Scattered Diverticulosis of left colon  . CT Scan of chest  06/12/2012   with contrast; 3 tiny nodules in both Lungs. Recommned repeat in 6 months. Otherwise normal  . Myocardial Perfusion Scan  06/14/2012   Normal  . UPPER GI ENDOSCOPY  07/31/2012   Dr. Dionne Milo, H. Pylori neg; Normal biopsy gastric antrum    Family History  Family Status  Relation Name Status  . Mother  Alive  . Father  Alive  . Sister  Deceased  . Brother  Alive  . Sister  Alive   His family history includes Cancer in his father; Diabetes in his father, mother, and sister; Hypertension in his father and mother.     No Known Allergies  Previous Medications   DESONIDE (DESOWEN) 0.05 % CREAM    APPLY TO AFFECTED AREAS TWICE DAILY FOR A COUPLE WEEKS AT A TIME. NOT FOR LONG TERM USE.   FLUTICASONE (FLONASE) 50 MCG/ACT NASAL SPRAY    USE 2 SPRAYS IN EACH NOSTRIL DAILY   KETOCONAZOLE (NIZORAL) 2 % CREAM    MIX WITH DESONIDE GEL TO APPLY TO AFFECTED AREA IN GROIN AS NEEDED   LISINOPRIL (ZESTRIL) 10 MG TABLET    TAKE 1 TABLET BY MOUTH EVERY DAY   OMEPRAZOLE (PRILOSEC) 40 MG CAPSULE    1 CAPSULE DR, ORAL, 30 MINUTES PRIOR TO EVENING MEAL   TALTZ 80 MG/ML SOAJ         Patient Care Team: Birdie Sons, MD as PCP - General (Family Medicine) Lyndon Code, MD as Referring Physician (Radiation Oncology) Velda Shell, PA-C as Physician Assistant (Dermatology) Al Decant, MD as Consulting Physician (Urology)      Objective:   Vitals: BP (!) 136/99   Pulse 86   Temp (!) 97.3 F (36.3 C) (Oral)   Resp 16   Ht 5\' 9"  (1.753 m)   Wt 215 lb 9.6 oz (97.8 kg)   BMI 31.84 kg/m    Physical Exam   General Appearance:    Obese male. Alert, cooperative, in no acute distress, appears stated age  Head:    Normocephalic, without obvious abnormality, atraumatic  Eyes:    PERRL, conjunctiva/corneas clear, EOM's intact, fundi    benign, both eyes  Ears:    Normal TM's and external ear canals, both ears  Nose:   Nares normal, septum midline, mucosa normal, no drainage   or sinus tenderness  Throat:   Lips, mucosa, and tongue normal; teeth and gums normal  Neck:   Supple, symmetrical, trachea midline, no adenopathy;       thyroid:  No enlargement/tenderness/nodules; no carotid   bruit or JVD  Back:     Symmetric, no curvature, ROM normal, no CVA tenderness  Lungs:     Clear to auscultation bilaterally, respirations unlabored  Chest wall:    No tenderness or deformity  Heart:    Normal heart rate. Normal rhythm. No murmurs, rubs, or gallops.  S1 and S2 normal  Abdomen:     Soft, non-tender, bowel sounds active all four quadrants,    no masses, no organomegaly  Genitalia:    deferred  Rectal:    deferred  Extremities:   All extremities are intact. No cyanosis or edema  Pulses:   2+ and symmetric all extremities  Skin:   Skin color, texture, turgor normal, no rashes or lesions  Lymph nodes:   Cervical, supraclavicular, and axillary nodes normal  Neurologic:   CNII-XII intact. Normal strength, sensation and reflexes      throughout    Depression Screen PHQ 2/9 Scores 06/12/2019 06/06/2018 05/05/2017  PHQ - 2 Score 0 0 0  PHQ- 9 Score 0 1  0      Assessment & Plan:     Routine Health Maintenance and Physical Exam  Exercise Activities and Dietary recommendations Goals   None     Immunization History  Administered Date(s) Administered  . Hepatitis B, adult 09/15/2017, 10/17/2017  . Influenza,inj,Quad PF,6+ Mos 03/21/2015, 04/07/2016  . Influenza-Unspecified 04/14/2017  . Tdap 03/21/2015  . Zoster Recombinat (Shingrix) 09/15/2017    Health Maintenance  Topic Date Due  . HIV Screening  12/12/1976  . COLONOSCOPY  07/31/2022  . TETANUS/TDAP  03/20/2025  . INFLUENZA VACCINE  Completed  . Hepatitis C Screening  Completed     Discussed health benefits of physical activity, and encouraged him to engage in regular exercise appropriate for his age and condition.    --------------------------------------------------------------------  1. Annual physical exam  - Comprehensive metabolic panel - Lipid panel (fasting) - CBC  2. Prediabetes  - HgB A1c  3. Pulmonary emphysema, unspecified emphysema type ([place]) Reinforced importance of smoking cessation  4. Essential hypertension Off lisinopril due to cough - CBC - valsartan (DIOVAN) 80 MG tablet; Take 1 tablet (80 mg total) by mouth daily.  Dispense: 30 tablet; Refill: 1  Follow up 1 month bp check  5. Hypertriglyceridemia  - Lipid panel (fasting) - CBC  6. Prostate cancer ([place]) Continue follow up [facility]  7. Smoking greater than 30 pack years Encourage smoking cessation. Is schedule for LDCT  8. Need for zoster vaccination  - Varicella-zoster vaccine IM (Shingrix) #2  The entirety of the information documented in the History of Present Illness, Review of Systems and Physical Exam were personally obtained by me. Portions of this information were initially documented by Minette Headland, CMA and reviewed by me for thoroughness and accuracy.

## 2019-06-12 ENCOUNTER — Ambulatory Visit (INDEPENDENT_AMBULATORY_CARE_PROVIDER_SITE_OTHER): Payer: BC Managed Care – PPO | Admitting: Family Medicine

## 2019-06-12 ENCOUNTER — Encounter: Payer: Self-pay | Admitting: Family Medicine

## 2019-06-12 ENCOUNTER — Other Ambulatory Visit: Payer: Self-pay

## 2019-06-12 VITALS — BP 136/99 | HR 86 | Temp 97.3°F | Resp 16 | Ht 69.0 in | Wt 215.6 lb

## 2019-06-12 DIAGNOSIS — F17210 Nicotine dependence, cigarettes, uncomplicated: Secondary | ICD-10-CM

## 2019-06-12 DIAGNOSIS — I10 Essential (primary) hypertension: Secondary | ICD-10-CM | POA: Diagnosis not present

## 2019-06-12 DIAGNOSIS — J439 Emphysema, unspecified: Secondary | ICD-10-CM

## 2019-06-12 DIAGNOSIS — E781 Pure hyperglyceridemia: Secondary | ICD-10-CM

## 2019-06-12 DIAGNOSIS — Z0000 Encounter for general adult medical examination without abnormal findings: Secondary | ICD-10-CM

## 2019-06-12 DIAGNOSIS — Z23 Encounter for immunization: Secondary | ICD-10-CM | POA: Diagnosis not present

## 2019-06-12 DIAGNOSIS — R7303 Prediabetes: Secondary | ICD-10-CM | POA: Diagnosis not present

## 2019-06-12 DIAGNOSIS — C61 Malignant neoplasm of prostate: Secondary | ICD-10-CM

## 2019-06-12 MED ORDER — VALSARTAN 80 MG PO TABS: 80.0000 mg | ORAL_TABLET | Freq: Every day | ORAL | 1 refills | Status: DC

## 2019-06-12 NOTE — Patient Instructions (Addendum)
.   Please review the attached list of medications and notify my office if there are any errors.   . Please bring all of your medications to every appointment so we can make sure that our medication list is the same as yours.   

## 2019-06-13 ENCOUNTER — Telehealth: Payer: Self-pay

## 2019-06-13 DIAGNOSIS — E781 Pure hyperglyceridemia: Secondary | ICD-10-CM

## 2019-06-13 LAB — COMPREHENSIVE METABOLIC PANEL
ALT: 35 IU/L (ref 0–44)
AST: 26 IU/L (ref 0–40)
Albumin/Globulin Ratio: 2 (ref 1.2–2.2)
Albumin: 4.4 g/dL (ref 3.8–4.9)
Alkaline Phosphatase: 74 IU/L (ref 39–117)
BUN/Creatinine Ratio: 13 (ref 9–20)
BUN: 11 mg/dL (ref 6–24)
Bilirubin Total: 0.4 mg/dL (ref 0.0–1.2)
CO2: 21 mmol/L (ref 20–29)
Calcium: 9.3 mg/dL (ref 8.7–10.2)
Chloride: 101 mmol/L (ref 96–106)
Creatinine, Ser: 0.87 mg/dL (ref 0.76–1.27)
GFR calc Af Amer: 111 mL/min/{1.73_m2} (ref >59)
GFR calc non Af Amer: 96 mL/min/{1.73_m2} (ref >59)
Globulin, Total: 2.2 g/dL (ref 1.5–4.5)
Glucose: 114 mg/dL — ABNORMAL HIGH (ref 65–99)
Potassium: 4.6 mmol/L (ref 3.5–5.2)
Sodium: 137 mmol/L (ref 134–144)
Total Protein: 6.6 g/dL (ref 6.0–8.5)

## 2019-06-13 LAB — CBC
Hematocrit: 44.2 % (ref 37.5–51.0)
Hemoglobin: 15.5 g/dL (ref 13.0–17.7)
MCH: 32.2 pg (ref 26.6–33.0)
MCHC: 35.1 g/dL (ref 31.5–35.7)
MCV: 92 fL (ref 79–97)
Platelets: 301 10*3/uL (ref 150–450)
RBC: 4.82 x10E6/uL (ref 4.14–5.80)
RDW: 12.1 % (ref 11.6–15.4)
WBC: 6.8 10*3/uL (ref 3.4–10.8)

## 2019-06-13 LAB — LIPID PANEL
Chol/HDL Ratio: 4.7 ratio (ref 0.0–5.0)
Cholesterol, Total: 185 mg/dL (ref 100–199)
HDL: 39 mg/dL — ABNORMAL LOW (ref >39)
LDL Chol Calc (NIH): 113 mg/dL — ABNORMAL HIGH (ref 0–99)
Triglycerides: 185 mg/dL — ABNORMAL HIGH (ref 0–149)
VLDL Cholesterol Cal: 33 mg/dL (ref 5–40)

## 2019-06-13 LAB — HEMOGLOBIN A1C
Est. average glucose Bld gHb Est-mCnc: 131 mg/dL
Hgb A1c MFr Bld: 6.2 % — ABNORMAL HIGH (ref 4.8–5.6)

## 2019-06-13 MED ORDER — PRAVASTATIN SODIUM 20 MG PO TABS: 20.0000 mg | ORAL_TABLET | Freq: Every day | ORAL | 3 refills | Status: DC

## 2019-06-13 NOTE — Telephone Encounter (Signed)
-----   Message from Birdie Sons, MD sent at 06/13/2019  8:54 AM EST ----- Ldl cholesterol is too high at 113, needs to be under 80, and triglycerides too high at 185, need to be under 150. Need to start pravastatin 20mg  once a day, #30, rf x 3. Follow up in January for BP and cholesterol check as scheduled.

## 2019-06-13 NOTE — Telephone Encounter (Signed)
Patient advised and agrees with treatment plan. Prescription sent into pharmacy.  ?

## 2019-06-26 ENCOUNTER — Telehealth: Payer: Self-pay

## 2019-06-26 NOTE — Telephone Encounter (Signed)
Called patient to inform him that it is time for his annual lung cancer screening CT scan patient states that he is worried about the fee for the CT scan and I advised him that there is financial help for that and he states that he wants to call back at the beginning of the year

## 2019-07-04 ENCOUNTER — Other Ambulatory Visit: Payer: Self-pay | Admitting: Family Medicine

## 2019-07-04 DIAGNOSIS — I10 Essential (primary) hypertension: Secondary | ICD-10-CM

## 2019-07-20 ENCOUNTER — Other Ambulatory Visit: Payer: Self-pay

## 2019-07-20 ENCOUNTER — Ambulatory Visit: Payer: BC Managed Care – PPO | Admitting: Family Medicine

## 2019-07-20 ENCOUNTER — Encounter: Payer: Self-pay | Admitting: Family Medicine

## 2019-07-20 VITALS — BP 132/72 | HR 77 | Temp 96.6°F | Resp 18 | Ht 69.0 in | Wt 223.0 lb

## 2019-07-20 DIAGNOSIS — I10 Essential (primary) hypertension: Secondary | ICD-10-CM

## 2019-07-20 DIAGNOSIS — E781 Pure hyperglyceridemia: Secondary | ICD-10-CM

## 2019-07-20 NOTE — Patient Instructions (Signed)
Please go to the lab draw station in [address] on the second floor of [facility]  when you are fasting for 8 hours. Normal hours are 8:00am to 12:30pm and 1:30pm to 4:00pm Monday through Friday  . Please stop smoking

## 2019-07-20 NOTE — Progress Notes (Signed)
Patient: Troy Cuevas Male    DOB: 1961/05/17   59 y.o.   MRN: [id number] Visit Date: 07/20/2019  Today's Provider: [name], MD   Chief Complaint  Patient presents with  . Hypertension   Subjective:      Hypertension, follow-up:  BP Readings from Last 3 Encounters:  07/20/19 132/72  06/12/19 (!) 136/99  12/08/18 136/86    He was last seen for hypertension 1 months ago.  BP at that visit was 136/99. Management since that visit includes changing lisinopril to valsartan due to chronic ough .He reports good compliance with treatment and reports cough has completely resolved since stopping lisinopril.   ------------------------------------------------------------------------  He is also due to check lipids. Is doing well with pravastatin which he is tolerating well without any adverse effects.     Allergies  Allergen Reactions  . Lisinopril Cough     Current Outpatient Medications:  .  desonide (DESOWEN) 0.05 % cream, APPLY TO AFFECTED AREAS TWICE DAILY FOR A COUPLE WEEKS AT A TIME. NOT FOR LONG TERM USE., Disp: , Rfl: 1 .  fluticasone (FLONASE) 50 MCG/ACT nasal spray, USE 2 SPRAYS IN EACH NOSTRIL DAILY, Disp: 48 mL, Rfl: 2 .  ketoconazole (NIZORAL) 2 % cream, MIX WITH DESONIDE GEL TO APPLY TO AFFECTED AREA IN GROIN AS NEEDED, Disp: , Rfl: 5 .  omeprazole (PRILOSEC) 40 MG capsule, 1 CAPSULE DR, ORAL, 30 MINUTES PRIOR TO EVENING MEAL, Disp: 90 capsule, Rfl: 4 .  pravastatin (PRAVACHOL) 20 MG tablet, Take 1 tablet (20 mg total) by mouth daily., Disp: 30 tablet, Rfl: 3 .  TALTZ 80 MG/ML SOAJ, , Disp: , Rfl:  .  valsartan (DIOVAN) 80 MG tablet, TAKE 1 TABLET BY MOUTH EVERY DAY, Disp: 30 tablet, Rfl: 1  Review of Systems  Constitutional: Negative.   HENT: Negative.   Eyes: Negative.   Respiratory: Negative.   Cardiovascular: Negative.   Gastrointestinal: Negative.   Endocrine: Negative.   Genitourinary: Negative.   Musculoskeletal: Positive for  myalgias.  Skin: Negative.   Allergic/Immunologic: Negative.   Neurological: Negative.   Hematological: Negative.   Psychiatric/Behavioral: Negative.     Social History   Tobacco Use  . Smoking status: Current Every Day Smoker    Packs/day: 1.00    Years: 35.00    Pack years: 35.00    Types: Cigarettes    Last attempt to quit: 05/25/2012    Years since quitting: 7.1  . Smokeless tobacco: Never Used  Substance Use Topics  . Alcohol use: Yes    Alcohol/week: 0.0 standard drinks    Comment: occasional       Objective:   BP 132/72   Pulse 77   Temp (!) 96.6 F (35.9 C) (Temporal)   Resp 18   Ht 5\' 9"  (1.753 m)   Wt 223 lb (101.2 kg)   SpO2 97%   BMI 32.93 kg/m  Vitals:   07/20/19 1600  BP: 132/72  Pulse: 77  Resp: 18  Temp: (!) 96.6 F (35.9 C)  TempSrc: Temporal  SpO2: 97%  Weight: 223 lb (101.2 kg)  Height: 5\' 9"  (1.753 m)  Body mass index is 32.93 kg/m.   Physical Exam   General Appearance:    Obese male in no acute distress  Eyes:    PERRL, conjunctiva/corneas clear, EOM's intact       Lungs:     Clear to auscultation bilaterally, respirations unlabored  Heart:    Normal heart rate.  Normal rhythm. No murmurs, rubs, or gallops.   MS:   All extremities are intact.   Neurologic:   Awake, alert, oriented x 3. No apparent focal neurological           defect.           Assessment & Plan    1. Essential hypertension Fairly well controlled with change to valsartan and cough has resolved since stopping lisinopril  - Renal function panel  2. Hypertriglyceridemia He is tolerating pravastatin well with no adverse effects.   - Comprehensive metabolic panel - Lipid panel  The entirety of the information documented in the History of Present Illness, Review of Systems and Physical Exam were personally obtained by me. Portions of this information were initially documented by [facility], CMA and reviewed by me for thoroughness and accuracy.      [name], MD  [facility]

## 2019-07-24 ENCOUNTER — Telehealth: Payer: Self-pay

## 2019-07-24 ENCOUNTER — Encounter: Payer: Self-pay | Admitting: Family Medicine

## 2019-07-24 LAB — COMPREHENSIVE METABOLIC PANEL
ALT: 38 IU/L (ref 0–44)
AST: 28 IU/L (ref 0–40)
Albumin/Globulin Ratio: 1.7 (ref 1.2–2.2)
Albumin: 4.3 g/dL (ref 3.8–4.9)
Alkaline Phosphatase: 70 IU/L (ref 39–117)
BUN/Creatinine Ratio: 18 (ref 9–20)
BUN: 17 mg/dL (ref 6–24)
Bilirubin Total: 0.4 mg/dL (ref 0.0–1.2)
CO2: 22 mmol/L (ref 20–29)
Calcium: 9 mg/dL (ref 8.7–10.2)
Chloride: 101 mmol/L (ref 96–106)
Creatinine, Ser: 0.94 mg/dL (ref 0.76–1.27)
GFR calc Af Amer: 104 mL/min/{1.73_m2} (ref >59)
GFR calc non Af Amer: 90 mL/min/{1.73_m2} (ref >59)
Globulin, Total: 2.6 g/dL (ref 1.5–4.5)
Glucose: 109 mg/dL — ABNORMAL HIGH (ref 65–99)
Potassium: 4.5 mmol/L (ref 3.5–5.2)
Sodium: 136 mmol/L (ref 134–144)
Total Protein: 6.9 g/dL (ref 6.0–8.5)

## 2019-07-24 LAB — RENAL FUNCTION PANEL: Phosphorus: 3.2 mg/dL (ref 2.8–4.1)

## 2019-07-24 LAB — LIPID PANEL
Chol/HDL Ratio: 4.1 ratio (ref 0.0–5.0)
Cholesterol, Total: 154 mg/dL (ref 100–199)
HDL: 38 mg/dL — ABNORMAL LOW (ref >39)
LDL Chol Calc (NIH): 88 mg/dL (ref 0–99)
Triglycerides: 163 mg/dL — ABNORMAL HIGH (ref 0–149)
VLDL Cholesterol Cal: 28 mg/dL (ref 5–40)

## 2019-07-24 NOTE — Telephone Encounter (Signed)
Patient advised as below.  

## 2019-07-24 NOTE — Telephone Encounter (Signed)
-----   Message from Birdie Sons, MD sent at 07/24/2019  8:01 AM EST ----- Cholesterol much better, continue current dose of pravastatin. Check yearly.

## 2019-07-29 ENCOUNTER — Other Ambulatory Visit: Payer: Self-pay | Admitting: Family Medicine

## 2019-07-29 DIAGNOSIS — I10 Essential (primary) hypertension: Secondary | ICD-10-CM

## 2019-08-02 ENCOUNTER — Encounter: Payer: Self-pay | Admitting: *Deleted

## 2019-08-22 ENCOUNTER — Encounter: Payer: Self-pay | Admitting: Family Medicine

## 2019-08-22 DIAGNOSIS — I10 Essential (primary) hypertension: Secondary | ICD-10-CM

## 2019-08-22 NOTE — Telephone Encounter (Signed)
Encountered opened in error

## 2019-09-04 ENCOUNTER — Other Ambulatory Visit: Payer: Self-pay | Admitting: Family Medicine

## 2019-09-04 DIAGNOSIS — E781 Pure hyperglyceridemia: Secondary | ICD-10-CM

## 2019-09-04 NOTE — Telephone Encounter (Signed)
Requested Prescriptions  Pending Prescriptions Disp Refills  . pravastatin (PRAVACHOL) 20 MG tablet [Pharmacy Med Name: PRAVASTATIN SODIUM 20 MG TAB] 90 tablet 1    Sig: TAKE 1 TABLET BY MOUTH EVERY DAY     Cardiovascular:  Antilipid - Statins Failed - 09/04/2019  1:33 AM      Failed - LDL in normal range and within 360 days    LDL Cholesterol (Calc)  Date Value Ref Range Status  05/05/2017 111 (H) mg/dL (calc) Final    Comment:    Reference range: <100 . Desirable range <100 mg/dL for primary prevention;   <70 mg/dL for patients with CHD or diabetic patients  with > or = 2 CHD risk factors. Marland Kitchen LDL-C is now calculated using the Martin-Hopkins  calculation, which is a validated novel method providing  better accuracy than the Friedewald equation in the  estimation of LDL-C.  Cresenciano Genre et al. Annamaria Helling. [id number]): 2061-2068  (http://education.[employer].com/faq/FAQ164)    LDL Chol Calc (NIH)  Date Value Ref Range Status  07/23/2019 88 0 - 99 mg/dL Final         Failed - HDL in normal range and within 360 days    HDL  Date Value Ref Range Status  07/23/2019 38 (L) >39 mg/dL Final         Failed - Triglycerides in normal range and within 360 days    Triglycerides  Date Value Ref Range Status  07/23/2019 163 (H) 0 - 149 mg/dL Final         Passed - Total Cholesterol in normal range and within 360 days    Cholesterol, Total  Date Value Ref Range Status  07/23/2019 154 100 - 199 mg/dL Final         Passed - Patient is not pregnant      Passed - Valid encounter within last 12 months    Recent Outpatient Visits          1 month ago Essential hypertension   [place], Donald E, MD   2 months ago Annual physical exam   [facility] Birdie Sons, MD   9 months ago Essential hypertension   [facility] Birdie Sons, MD   1 year ago Annual physical exam   [facility] Birdie Sons, MD   2  years ago Annual physical exam   [facility] Birdie Sons, MD

## 2019-10-22 ENCOUNTER — Other Ambulatory Visit: Payer: Self-pay | Admitting: Family Medicine

## 2020-01-18 ENCOUNTER — Other Ambulatory Visit: Payer: Self-pay | Admitting: Family Medicine

## 2020-04-23 LAB — PSA: PSA: <0.04

## 2020-07-04 ENCOUNTER — Encounter: Payer: Self-pay | Admitting: Family Medicine

## 2020-07-04 ENCOUNTER — Other Ambulatory Visit: Payer: Self-pay

## 2020-07-04 ENCOUNTER — Ambulatory Visit (INDEPENDENT_AMBULATORY_CARE_PROVIDER_SITE_OTHER): Payer: BC Managed Care – PPO | Admitting: Family Medicine

## 2020-07-04 VITALS — BP 152/96 | HR 84 | Temp 98.8°F | Resp 16 | Ht 69.0 in | Wt 219.0 lb

## 2020-07-04 DIAGNOSIS — J439 Emphysema, unspecified: Secondary | ICD-10-CM | POA: Diagnosis not present

## 2020-07-04 DIAGNOSIS — E785 Hyperlipidemia, unspecified: Secondary | ICD-10-CM

## 2020-07-04 DIAGNOSIS — Z0000 Encounter for general adult medical examination without abnormal findings: Secondary | ICD-10-CM | POA: Diagnosis not present

## 2020-07-04 DIAGNOSIS — Z8546 Personal history of malignant neoplasm of prostate: Secondary | ICD-10-CM

## 2020-07-04 DIAGNOSIS — R7303 Prediabetes: Secondary | ICD-10-CM

## 2020-07-04 DIAGNOSIS — I10 Essential (primary) hypertension: Secondary | ICD-10-CM | POA: Diagnosis not present

## 2020-07-04 DIAGNOSIS — H539 Unspecified visual disturbance: Secondary | ICD-10-CM

## 2020-07-04 DIAGNOSIS — F17210 Nicotine dependence, cigarettes, uncomplicated: Secondary | ICD-10-CM

## 2020-07-04 NOTE — Progress Notes (Signed)
Complete physical exam   Patient: Troy Cuevas   DOB: 1961-05-13   60 y.o. Male  MRN: [id number] Visit Date: 07/04/2020  Today's healthcare provider: [name], MD   Chief Complaint  Patient presents with  . Annual Exam  . Hypertension  . Hyperlipidemia   Subjective    Troy Cuevas is a 60 y.o. male who presents today for a complete physical exam.  He reports consuming a general diet. The patient does not participate in regular exercise at present. He generally feels fairly well. He reports sleeping fairly well. He does not have additional problems to discuss today.   Hypertension, follow-up  BP Readings from Last 3 Encounters:  07/20/19 132/72  06/12/19 (!) 136/99  12/08/18 136/86   Wt Readings from Last 3 Encounters:  07/20/19 223 lb (101.2 kg)  06/12/19 215 lb 9.6 oz (97.8 kg)  12/08/18 215 lb (97.5 kg)     He was last seen for hypertension 1 years ago.  BP at that visit was 132/72. Management since that visit includes no medication changes.  He reports good compliance with treatment. He is not having side effects.  He is following a Regular diet. He is not exercising. He does smoke.  Use of agents associated with hypertension: none.   Outside blood pressures are not being checked. Symptoms: No chest pain No chest pressure  No palpitations No syncope  No dyspnea No orthopnea  No paroxysmal nocturnal dyspnea No lower extremity edema   Pertinent labs: Lab Results  Component Value Date   CHOL 154 07/23/2019   HDL 38 (L) 07/23/2019   LDLCALC 88 07/23/2019   TRIG 163 (H) 07/23/2019   CHOLHDL 4.1 07/23/2019   Lab Results  Component Value Date   NA 136 07/23/2019   K 4.5 07/23/2019   CREATININE 0.94 07/23/2019   GFRNONAA 90 07/23/2019   GFRAA 104 07/23/2019   GLUCOSE 109 (H) 07/23/2019     The 10-year ASCVD risk score Mikey Bussing DC Jr., et al., 2013) is: 17.3%   Lipid/Cholesterol, Follow-up  Last lipid panel Other pertinent labs   Lab Results  Component Value Date   CHOL 154 07/23/2019   HDL 38 (L) 07/23/2019   LDLCALC 88 07/23/2019   TRIG 163 (H) 07/23/2019   CHOLHDL 4.1 07/23/2019   Lab Results  Component Value Date   ALT 38 07/23/2019   AST 28 07/23/2019   PLT 301 06/12/2019   TSH 0.831 03/21/2015     He was last seen for this 1 years ago.  Management since that visit includes no medication changes.  He reports excellent compliance with treatment. He is not having side effects.   Symptoms: No chest pain No chest pressure/discomfort  No dyspnea No lower extremity edema  No numbness or tingling of extremity No orthopnea  No palpitations No paroxysmal nocturnal dyspnea  No speech difficulty No syncope   Current diet: well balanced Current exercise: no regular exercise  The 10-year ASCVD risk score Mikey Bussing DC Brooke Bonito., et al., 2013) is: 17.3%    Past Medical History:  Diagnosis Date  . Basal cell carcinoma   . GERD (gastroesophageal reflux disease)   . History of chicken pox   . Prostate cancer ([place])   . Psoriasis    Past Surgical History:  Procedure Laterality Date  . CT SCAN  07/18/2012   CT of  Abdomen; Scattered Diverticulosis of left colon  . CT Scan of chest  06/12/2012   with contrast; 3  tiny nodules in both Lungs. Recommned repeat in 6 months. Otherwise normal  . Myocardial Perfusion Scan  06/14/2012   Normal  . UPPER GI ENDOSCOPY  07/31/2012   Dr. Niel Hummer, H. Pylori neg; Normal biopsy gastric antrum   Social History   Socioeconomic History  . Marital status: Married    Spouse name: Not on file  . Number of children: Not on file  . Years of education: 33  . Highest education level: Not on file  Occupational History  . Occupation: HVAC    Comment: Employed  Tobacco Use  . Smoking status: Current Every Day Smoker    Packs/day: 1.00    Years: 35.00    Pack years: 35.00    Types: Cigarettes    Last attempt to quit: 05/25/2012    Years since quitting: 8.1  . Smokeless  tobacco: Never Used  Substance and Sexual Activity  . Alcohol use: Yes    Alcohol/week: 0.0 standard drinks    Comment: occasional   . Drug use: No  . Sexual activity: Not on file  Other Topics Concern  . Not on file  Social History Narrative  . Not on file   Social Determinants of Health   Financial Resource Strain: Not on file  Food Insecurity: Not on file  Transportation Needs: Not on file  Physical Activity: Not on file  Stress: Not on file  Social Connections: Not on file  Intimate Partner Violence: Not on file   Family Status  Relation Name Status  . Mother  Alive  . Father  Alive  . Sister  Deceased  . Brother  Alive  . Sister  Alive   Family History  Problem Relation Age of Onset  . Hypertension Mother   . Diabetes Mother   . Hypertension Father   . Diabetes Father   . Cancer Father   . Diabetes Sister        type 1   Allergies  Allergen Reactions  . Lisinopril Cough    Patient Care Team: Malva Limes, MD as PCP - General (Family Medicine) Susie Cassette, MD as Referring Physician (Radiation Oncology) Lottie Dawson, PA-C as Physician Assistant (Dermatology) Hurshel Party, MD as Consulting Physician (Urology) Pa, [facility])   Medications: Outpatient Medications Prior to Visit  Medication Sig  . fluticasone (FLONASE) 50 MCG/ACT nasal spray USE 2 SPRAYS IN EACH NOSTRIL DAILY  . omeprazole (PRILOSEC) 40 MG capsule TAKE 1 CAPSULE BY MOUTH 30 MINUTES PRIOR TO EVENING MEAL  . pravastatin (PRAVACHOL) 20 MG tablet TAKE 1 TABLET BY MOUTH EVERY DAY  . TALTZ 80 MG/ML SOAJ   . valsartan (DIOVAN) 80 MG tablet TAKE 1 TABLET BY MOUTH EVERY DAY  . [DISCONTINUED] ketoconazole (NIZORAL) 2 % cream MIX WITH DESONIDE GEL TO APPLY TO AFFECTED AREA IN GROIN AS NEEDED  . [DISCONTINUED] desonide (DESOWEN) 0.05 % cream APPLY TO AFFECTED AREAS TWICE DAILY FOR A COUPLE WEEKS AT A TIME. NOT FOR LONG TERM USE.   No facility-administered medications prior  to visit.    Review of Systems    Objective    BP (!) 152/96   Pulse 84   Temp 98.8 F (37.1 C)   Resp 16   Ht 5\' 9"  (1.753 m)   Wt 219 lb (99.3 kg)   BMI 32.34 kg/m    Physical Exam    General Appearance:    Obese male. Alert, cooperative, in no acute distress, appears stated age  Head:  Normocephalic, without obvious abnormality, atraumatic  Eyes:    PERRL, conjunctiva/corneas clear, EOM's intact, fundi    benign, both eyes       Ears:    Normal TM's and external ear canals, both ears  Nose:   Nares normal, septum midline, mucosa normal, no drainage   or sinus tenderness  Throat:   Lips, mucosa, and tongue normal; teeth and gums normal  Neck:   Supple, symmetrical, trachea midline, no adenopathy;       thyroid:  No enlargement/tenderness/nodules; no carotid   bruit or JVD  Back:     Symmetric, no curvature, ROM normal, no CVA tenderness  Lungs:     Scattered expiratory wheezes, no rales,  respirations unlabored  Chest wall:    No tenderness or deformity  Heart:    Normal heart rate. Normal rhythm. No murmurs, rubs, or gallops.  S1 and S2 normal  Abdomen:     Soft, non-tender, bowel sounds active all four quadrants,    Non-tender ventral hernia noted,  no organomegaly  Genitalia:    deferred  Rectal:    deferred  Extremities:   All extremities are intact. No cyanosis or edema  Pulses:   2+ and symmetric all extremities  Skin:   Skin color, texture, turgor normal, no rashes or lesions  Lymph nodes:   Cervical, supraclavicular, and axillary nodes normal  Neurologic:   CNII-XII intact. Normal strength, sensation and reflexes      throughout     Last depression screening scores PHQ 2/9 Scores 06/12/2019 06/06/2018 05/05/2017  PHQ - 2 Score 0 0 0  PHQ- 9 Score 0 1 0   Last fall risk screening Fall Risk  06/12/2019  Falls in the past year? 0  Number falls in past yr: 0  Injury with Fall? 0   Last Audit-C alcohol use screening Alcohol Use Disorder Test (AUDIT)  06/12/2019  1. How often do you have a drink containing alcohol? 4  2. How many drinks containing alcohol do you have on a typical day when you are drinking? 1  3. How often do you have six or more drinks on one occasion? 0  AUDIT-C Score 5   A score of 3 or more in women, and 4 or more in men indicates increased risk for alcohol abuse, EXCEPT if all of the points are from question 1   No results found for any visits on 07/04/20.  Assessment & Plan    Routine Health Maintenance and Physical Exam  Exercise Activities and Dietary recommendations Goals   None     Immunization History  Administered Date(s) Administered  . Hepatitis B, adult 09/15/2017, 10/17/2017  . Influenza, Quadrivalent, Recombinant, Inj, Pf 05/19/2019  . Influenza,inj,Quad PF,6+ Mos 03/21/2015, 04/07/2016  . Influenza-Unspecified 04/14/2017  . Tdap 03/21/2015  . Zoster Recombinat (Shingrix) 09/15/2017, 06/12/2019    Health Maintenance  Topic Date Due  . COVID-19 Vaccine (1) Never done  . HIV Screening  Never done  . COLONOSCOPY (Pts 45-73yrs Insurance coverage will need to be confirmed)  07/31/2022  . TETANUS/TDAP  03/20/2025  . INFLUENZA VACCINE  Completed  . Hepatitis C Screening  Completed    Discussed health benefits of physical activity, and encouraged him to engage in regular exercise appropriate for his age and condition.  1. Essential hypertension BP not at goal. Consider increasing valsartan or adding additional medication after reviewing labs. Counseled on low sodium diet, smoking cessation and benefits of weight loss.  - CBC - Comprehensive  metabolic panel - TSH  2. Pulmonary emphysema, unspecified emphysema type ([place]) Advised to quite smoking. Not currently symptomatic.  3. Hyperlipidemia, unspecified hyperlipidemia type He is tolerating pravastatin well with no adverse effects.   - CBC - Comprehensive metabolic panel - Lipid panel - TSH  4. Prediabetes  - Hemoglobin A1c  5.  Vision changes Has not had routine eye exam for many years and has noticed he doesn't see as well out of his left eye as his right eye.  - Ambulatory referral to Ophthalmology  6. Smoking greater than 30 pack years Encouraged smoking cessation. Encouraged LDCT screening which he declined today due to cost. He is going to check with his insurance regarding cost and get back to me.   7. History of prostate cancer S/p prostatectomy at [facility], PSA followed by Dr. Lottie Rater  7. Annual physical exam  - CBC - Comprehensive metabolic panel - Lipid panel - Hemoglobin A1c - TSH   No follow-ups on file.     The entirety of the information documented in the History of Present Illness, Review of Systems and Physical Exam were personally obtained by me. Portions of this information were initially documented by the CMA and reviewed by me for thoroughness and accuracy.      [name], MD  [facility] [phone] (phone) [phone] (fax)  [place]

## 2020-07-04 NOTE — Patient Instructions (Addendum)
.   Please review the attached list of medications and notify my office if there are any errors.   . Please bring all of your medications to every appointment so we can make sure that our medication list is the same as yours.    Check with insurance regarding coverage for screening lung scan (Low dose chest CT for lung cancer screening)

## 2020-07-05 LAB — CBC
Hematocrit: 45.5 % (ref 37.5–51.0)
Hemoglobin: 16.3 g/dL (ref 13.0–17.7)
MCH: 32.3 pg (ref 26.6–33.0)
MCHC: 35.8 g/dL — ABNORMAL HIGH (ref 31.5–35.7)
MCV: 90 fL (ref 79–97)
Platelets: 297 10*3/uL (ref 150–450)
RBC: 5.04 x10E6/uL (ref 4.14–5.80)
RDW: 11.1 % — ABNORMAL LOW (ref 11.6–15.4)
WBC: 6.9 10*3/uL (ref 3.4–10.8)

## 2020-07-05 LAB — COMPREHENSIVE METABOLIC PANEL
ALT: 28 IU/L (ref 0–44)
AST: 24 IU/L (ref 0–40)
Albumin/Globulin Ratio: 1.6 (ref 1.2–2.2)
Albumin: 4.4 g/dL (ref 3.8–4.9)
Alkaline Phosphatase: 72 IU/L (ref 44–121)
BUN/Creatinine Ratio: 13 (ref 9–20)
BUN: 12 mg/dL (ref 6–24)
Bilirubin Total: 0.5 mg/dL (ref 0.0–1.2)
CO2: 24 mmol/L (ref 20–29)
Calcium: 9.6 mg/dL (ref 8.7–10.2)
Chloride: 96 mmol/L (ref 96–106)
Creatinine, Ser: 0.9 mg/dL (ref 0.76–1.27)
GFR calc Af Amer: 108 mL/min/{1.73_m2} (ref >59)
GFR calc non Af Amer: 94 mL/min/{1.73_m2} (ref >59)
Globulin, Total: 2.7 g/dL (ref 1.5–4.5)
Glucose: 107 mg/dL — ABNORMAL HIGH (ref 65–99)
Potassium: 4.7 mmol/L (ref 3.5–5.2)
Sodium: 137 mmol/L (ref 134–144)
Total Protein: 7.1 g/dL (ref 6.0–8.5)

## 2020-07-05 LAB — LIPID PANEL
Chol/HDL Ratio: 4.3 ratio (ref 0.0–5.0)
Cholesterol, Total: 170 mg/dL (ref 100–199)
HDL: 40 mg/dL (ref >39)
LDL Chol Calc (NIH): 98 mg/dL (ref 0–99)
Triglycerides: 186 mg/dL — ABNORMAL HIGH (ref 0–149)
VLDL Cholesterol Cal: 32 mg/dL (ref 5–40)

## 2020-07-05 LAB — HEMOGLOBIN A1C
Est. average glucose Bld gHb Est-mCnc: 140 mg/dL
Hgb A1c MFr Bld: 6.5 % — ABNORMAL HIGH (ref 4.8–5.6)

## 2020-07-05 LAB — TSH: TSH: 0.886 u[IU]/mL (ref 0.450–4.500)

## 2020-07-09 ENCOUNTER — Telehealth: Payer: Self-pay

## 2020-07-09 NOTE — Telephone Encounter (Signed)
Patient advised and verbalized understanding. Follow up appointment scheduled for 10/10/2020 at 8:40am.

## 2020-07-09 NOTE — Telephone Encounter (Signed)
-----   Message from Birdie Sons, MD sent at 07/05/2020  4:15 PM EST ----- Average blood sugar is now is now 140 which is right at the cut off for diabetes. Need to strictly avoid sweets and starchy food, and try exercise average of 30 minutes a day.   Needs to increase valsartan to 160 mg daily to get better control of blood pressure. Can finish the 80mg  tablet, but will need in rf for 160 mg before the 80s run out.   Need to schedule follow up for bp and sugar in 3 months.

## 2020-07-14 ENCOUNTER — Other Ambulatory Visit: Payer: Self-pay | Admitting: Family Medicine

## 2020-07-16 ENCOUNTER — Other Ambulatory Visit: Payer: Self-pay | Admitting: Family Medicine

## 2020-07-16 DIAGNOSIS — I10 Essential (primary) hypertension: Secondary | ICD-10-CM

## 2020-07-16 MED ORDER — VALSARTAN 160 MG PO TABS: 160.0000 mg | ORAL_TABLET | Freq: Every day | ORAL | 1 refills | Status: DC

## 2020-10-10 ENCOUNTER — Other Ambulatory Visit: Payer: Self-pay

## 2020-10-10 ENCOUNTER — Encounter: Payer: Self-pay | Admitting: Family Medicine

## 2020-10-10 ENCOUNTER — Ambulatory Visit: Payer: BC Managed Care – PPO | Admitting: Family Medicine

## 2020-10-10 VITALS — BP 138/88 | HR 78 | Temp 97.9°F | Resp 16 | Wt 221.4 lb

## 2020-10-10 DIAGNOSIS — I10 Essential (primary) hypertension: Secondary | ICD-10-CM | POA: Diagnosis not present

## 2020-10-10 DIAGNOSIS — R7303 Prediabetes: Secondary | ICD-10-CM

## 2020-10-10 LAB — POCT GLYCOSYLATED HEMOGLOBIN (HGB A1C)
Est. average glucose Bld gHb Est-mCnc: 126
Hemoglobin A1C: 6 % — AB (ref 4.0–5.6)

## 2020-10-10 NOTE — Progress Notes (Signed)
Established patient visit   Patient: Troy Cuevas   DOB: 1961-03-12   60 y.o. Male  MRN: [id number] Visit Date: 10/10/2020  Today's healthcare provider: [name], MD   Chief Complaint  Patient presents with  . Prediabetes  . Hypertension   Subjective    HPI  Prediabetes, Follow-up  Lab Results  Component Value Date   HGBA1C 6.0 (A) 10/10/2020   HGBA1C 6.5 (H) 07/04/2020   HGBA1C 6.2 (H) 06/12/2019   GLUCOSE 107 (H) 07/04/2020   GLUCOSE 109 (H) 07/23/2019   GLUCOSE 114 (H) 06/12/2019    Last seen for for this 3 months ago.  Management since that visit includes advising patient to strictly avoid sweets and starchy food, and try exercise average of 30 minutes a day.  Current symptoms include none and have been stable.  Prior visit with dietician: no Current diet: well balanced Has cut bread out of his diet.  Current exercise: walking 9,000-10,000 steps each day (monitored by an app on his phone)  Pertinent Labs:    Component Value Date/Time   CHOL 170 07/04/2020 0953   TRIG 186 (H) 07/04/2020 0953   CHOLHDL 4.3 07/04/2020 0953   CHOLHDL 4.5 05/05/2017 1052   CREATININE 0.90 07/04/2020 0953   CREATININE 0.77 05/05/2017 1052    Wt Readings from Last 3 Encounters:  10/10/20 221 lb 6.4 oz (100.4 kg)  07/04/20 219 lb (99.3 kg)  07/20/19 223 lb (101.2 kg)    -----------------------------------------------------------------------------------------  Hypertension, follow-up  BP Readings from Last 3 Encounters:  10/10/20 (!) 154/97  07/04/20 (!) 152/96  07/20/19 132/72   Wt Readings from Last 3 Encounters:  10/10/20 221 lb 6.4 oz (100.4 kg)  07/04/20 219 lb (99.3 kg)  07/20/19 223 lb (101.2 kg)     He was last seen for hypertension 3 months ago.  BP at that visit was 152/96. Management since that visit includes increasing valsartan to 160 mg daily to get better control of blood pressure.  He reports good compliance with treatment. He is not  having side effects.  He is following a Regular diet. He is exercising. He does smoke.  Use of agents associated with hypertension: none.   Outside blood pressures are not checked. Symptoms: No chest pain No chest pressure  No palpitations No syncope  No dyspnea No orthopnea  No paroxysmal nocturnal dyspnea No lower extremity edema   Pertinent labs: Lab Results  Component Value Date   CHOL 170 07/04/2020   HDL 40 07/04/2020   LDLCALC 98 07/04/2020   TRIG 186 (H) 07/04/2020   CHOLHDL 4.3 07/04/2020   Lab Results  Component Value Date   NA 137 07/04/2020   K 4.7 07/04/2020   CREATININE 0.90 07/04/2020   GFRNONAA 94 07/04/2020   GFRAA 108 07/04/2020   GLUCOSE 107 (H) 07/04/2020     The 10-year ASCVD risk score Mikey Bussing DC Jr., et al., 2013) is: 19.5%   ---------------------------------------------------------------------------------------------------     Medications: Outpatient Medications Prior to Visit  Medication Sig  . fluticasone (FLONASE) 50 MCG/ACT nasal spray USE 2 SPRAYS IN EACH NOSTRIL DAILY  . omeprazole (PRILOSEC) 40 MG capsule TAKE 1 CAPSULE BY MOUTH 30 MINUTES PRIOR TO EVENING MEAL  . pravastatin (PRAVACHOL) 20 MG tablet TAKE 1 TABLET BY MOUTH EVERY DAY  . TALTZ 80 MG/ML SOAJ   . valsartan (DIOVAN) 160 MG tablet Take 1 tablet (160 mg total) by mouth daily.   No facility-administered medications prior to visit.  Review of Systems  Constitutional: Negative for appetite change, chills and fever.  Respiratory: Negative for chest tightness, shortness of breath and wheezing.   Cardiovascular: Negative for chest pain and palpitations.  Gastrointestinal: Negative for abdominal pain, nausea and vomiting.       Objective    BP 138/88   Pulse 78   Temp 97.9 F (36.6 C) (Temporal)   Resp 16   Wt 221 lb 6.4 oz (100.4 kg)   BMI 32.70 kg/m     Physical Exam    General: Appearance:    Overweight male in no acute distress  Eyes:    PERRL,  conjunctiva/corneas clear, EOM's intact       Lungs:     Clear to auscultation bilaterally, respirations unlabored  Heart:    Normal heart rate. Normal rhythm. No murmurs, rubs, or gallops.   MS:   All extremities are intact.   Neurologic:   Awake, alert, oriented x 3. No apparent focal neurological           defect.        Results for orders placed or performed in visit on 10/10/20  POCT HgB A1C  Result Value Ref Range   Hemoglobin A1C 6.0 (A) 4.0 - 5.6 %   Est. average glucose Bld gHb Est-mCnc 126     Assessment & Plan     1. Prediabetes Much better since starting daily walking routine and eliminating bread from diet.   2. Essential hypertension Improved, although borderline with increased dose of valsartan. Expect this to improve if he continues current diet and exercise.  - Renal function panel  Follow up for CPE in December if labs normal. Sooner if any electrolyte abnormalities that require medication change.       The entirety of the information documented in the History of Present Illness, Review of Systems and Physical Exam were personally obtained by me. Portions of this information were initially documented by the CMA and reviewed by me for thoroughness and accuracy.      [name], MD  [facility] [phone] (phone) [phone] (fax)  [place]

## 2020-10-11 LAB — RENAL FUNCTION PANEL
Albumin: 4.6 g/dL (ref 3.8–4.9)
BUN/Creatinine Ratio: 17 (ref 9–20)
BUN: 16 mg/dL (ref 6–24)
CO2: 21 mmol/L (ref 20–29)
Calcium: 9.3 mg/dL (ref 8.7–10.2)
Chloride: 97 mmol/L (ref 96–106)
Creatinine, Ser: 0.92 mg/dL (ref 0.76–1.27)
Glucose: 110 mg/dL — ABNORMAL HIGH (ref 65–99)
Phosphorus: 3.3 mg/dL (ref 2.8–4.1)
Potassium: 4.7 mmol/L (ref 3.5–5.2)
Sodium: 139 mmol/L (ref 134–144)
eGFR: 96 mL/min/{1.73_m2} (ref >59)

## 2020-10-13 ENCOUNTER — Telehealth: Payer: Self-pay

## 2020-10-13 NOTE — Telephone Encounter (Signed)
-----   Message from Birdie Sons, MD sent at 10/13/2020  7:39 AM EDT ----- Kidney functions and electrolytes are normal. Continue current dose of valsartan. Please schedule CPE in December

## 2020-10-13 NOTE — Telephone Encounter (Signed)
LMTCB 10/13/2020.  PEC please advise pt of lab results below and also schedule his CPE in December.    Thanks,   -Mickel Baas

## 2020-11-12 IMAGING — CT CT CHEST LUNG CANCER SCREENING LOW DOSE W/O CM
2 of 5 series · 15 of 40 positions shown, 18 images · non-contrast
Comparison: Low-dose lung cancer screening chest CT 05/17/2017.

CLINICAL DATA: 57-year-old male current smoker with 36 pack year
history of smoking. Lung cancer screening examination.

EXAM:
CT CHEST WITHOUT CONTRAST LOW-DOSE FOR LUNG CANCER SCREENING
TECHNIQUE: Multidetector CT imaging of the chest was performed following the
standard protocol without IV contrast.

[Series 3: lung · axial · 0.78mm/px · z∈[-1296,-969]mm · 12 of 361 slices shown, 15 images (1 of 2)]
[im 17/361  mediastinal]
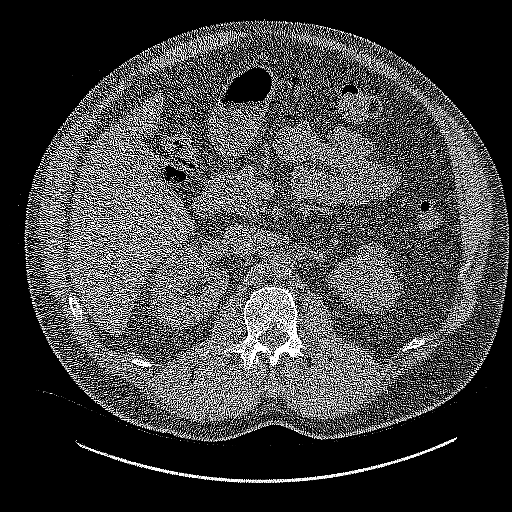
[im 17/361  lung]
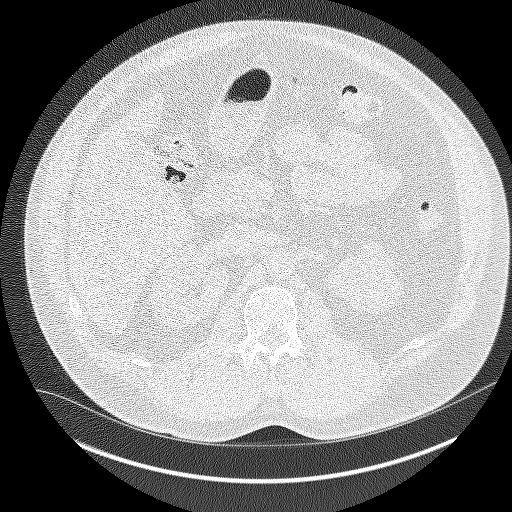
[im 50/361  lung]
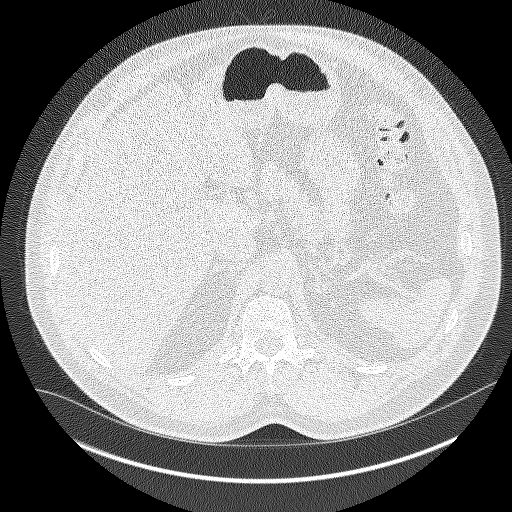
[im 82/361  lung]
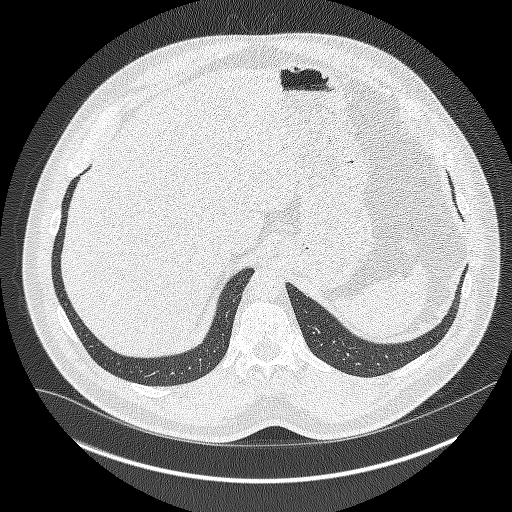
[im 115/361  lung]
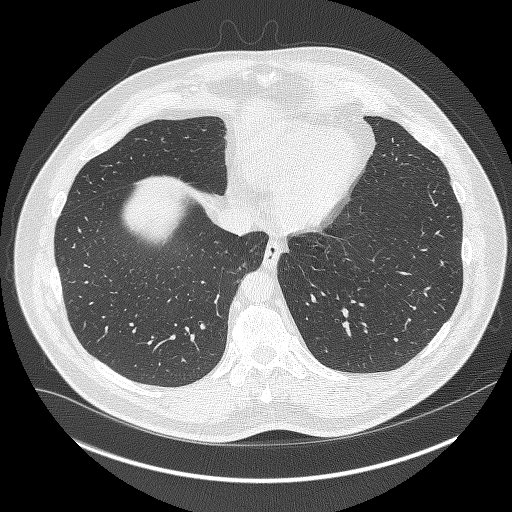
[im 131/361  mediastinal]
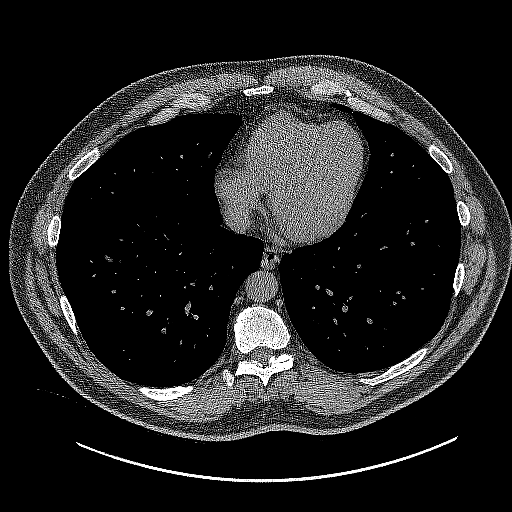
[im 131/361  lung]
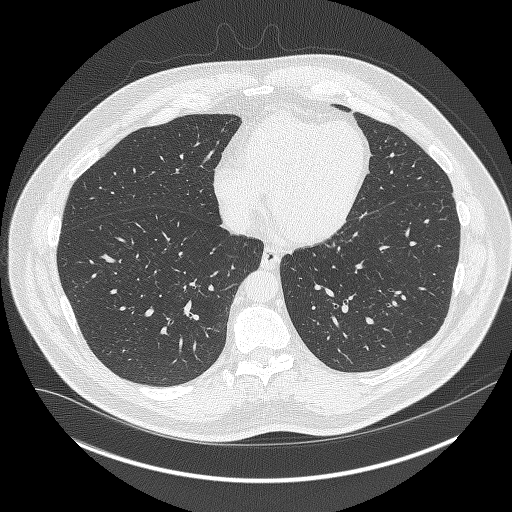
[im 164/361  lung]
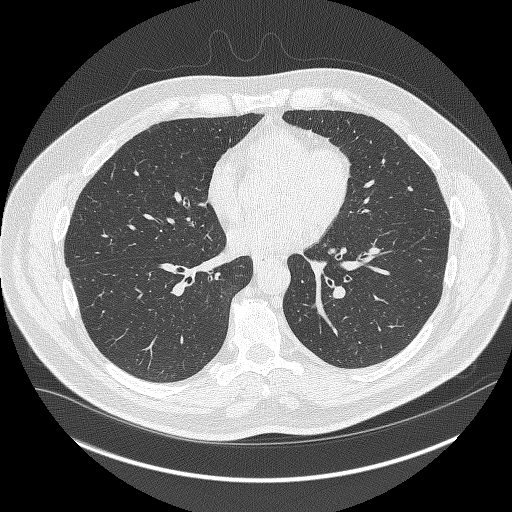
[im 197/361  lung]
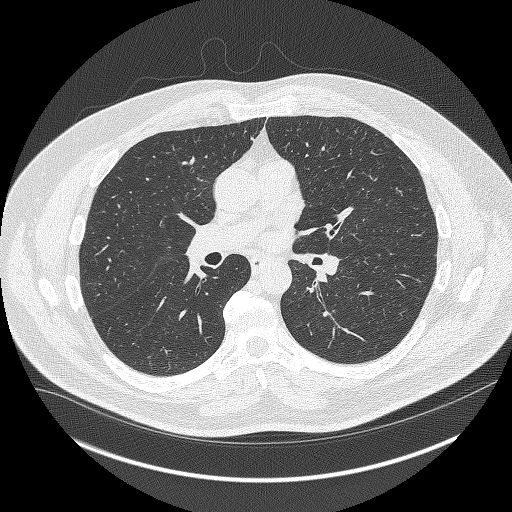
[im 230/361  lung]
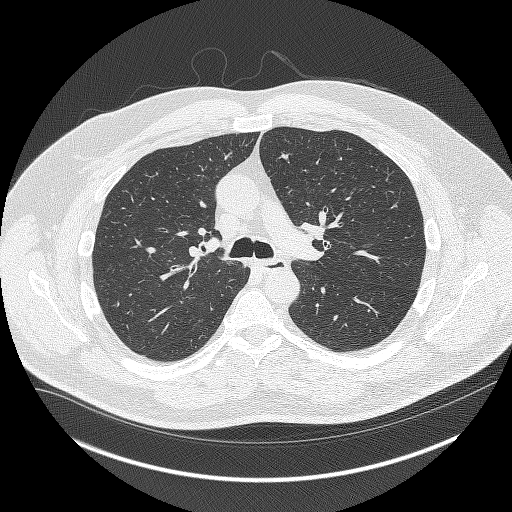
[im 246/361  mediastinal]
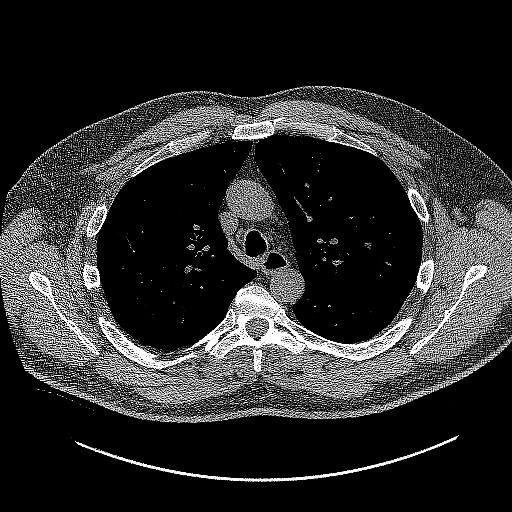
[im 246/361  lung]
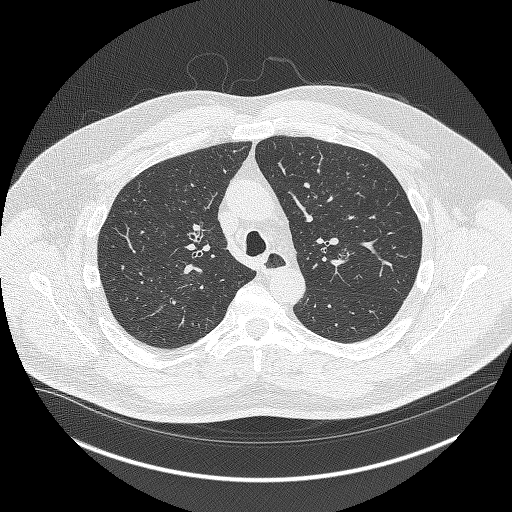
[im 279/361  lung]
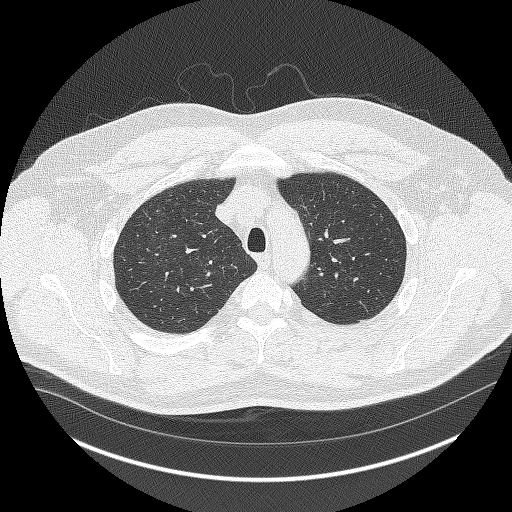
[im 311/361  lung]
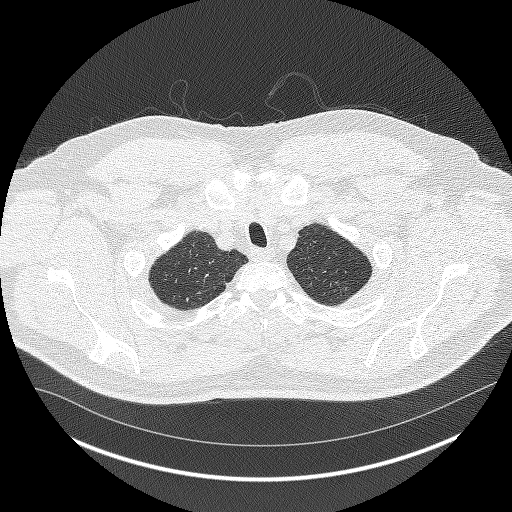
[im 344/361  lung]
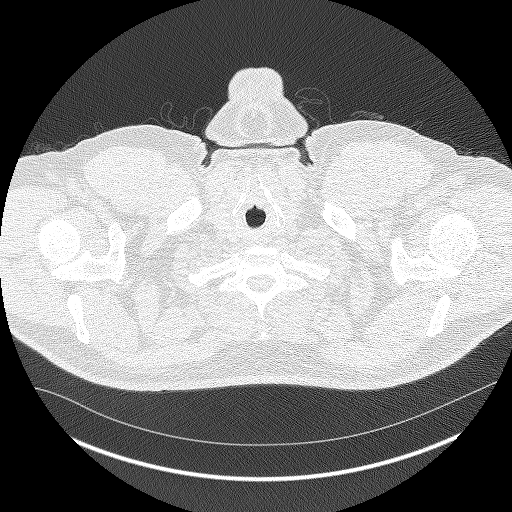

[Series 4: lung · coronal · 0.71mm/px · 3 of 329 slices shown (2 of 2)]
[im 66/329  lung]
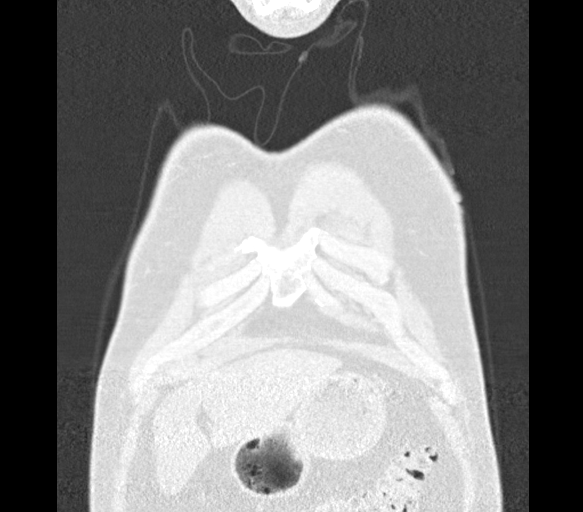
[im 132/329  lung]
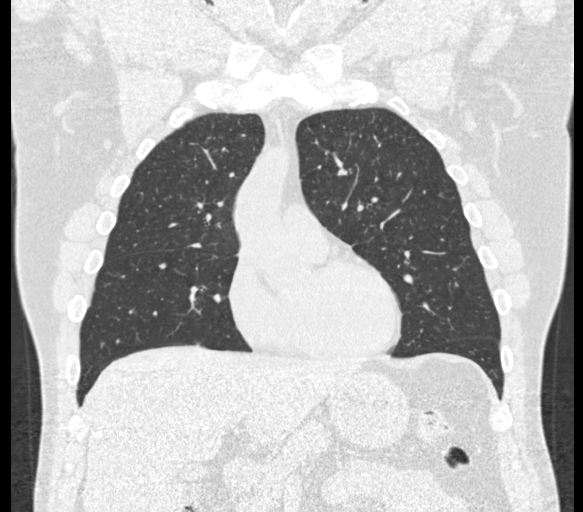
[im 197/329  lung]
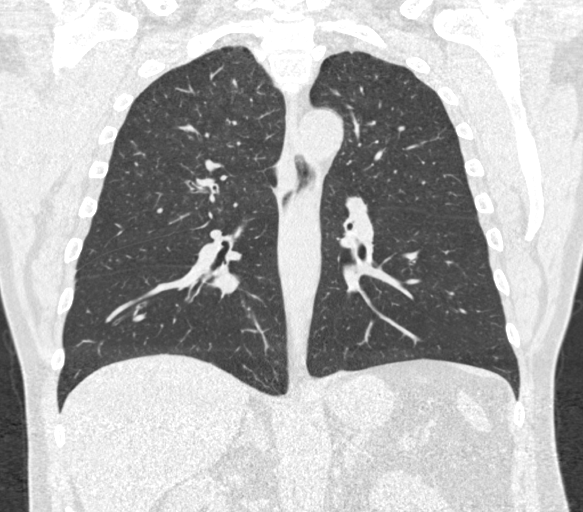

[15 of 40 positions shown; findings below may reference images not displayed]

FINDINGS: Cardiovascular: Heart size is normal. There is no significant
pericardial fluid, thickening or pericardial calcification. No
atherosclerotic calcifications are noted in the thoracic aorta or
the coronary arteries.

Mediastinum/Nodes: No pathologically enlarged mediastinal or hilar
lymph nodes. Please note that accurate exclusion of hilar adenopathy
is limited on noncontrast CT scans. Esophagus is unremarkable in
appearance. No axillary lymphadenopathy.

Lungs/Pleura: Multiple small pulmonary nodules are noted throughout
both lungs, largest of which is in the right middle lobe associated
with the minor fissure (axial image 182 of series 3), with a volume
derived mean diameter of only 3.5 mm. No larger more suspicious
appearing pulmonary nodules or masses are noted. No acute
consolidative airspace disease. No pleural effusions. Mild diffuse
bronchial wall thickening with mild centrilobular and paraseptal
emphysema.

Upper Abdomen: Unremarkable.

Musculoskeletal: There are no aggressive appearing lytic or blastic
lesions noted in the visualized portions of the skeleton.
IMPRESSION: 1. Lung-RADS 2, benign appearance or behavior. Continue annual
screening with low-dose chest CT without contrast in 12 months.
2. Mild diffuse bronchial wall thickening with mild centrilobular
and paraseptal emphysema; imaging findings suggestive of underlying
COPD.

Emphysema ([id number]).

## 2020-11-13 ENCOUNTER — Other Ambulatory Visit: Payer: Self-pay | Admitting: Family Medicine

## 2020-11-13 DIAGNOSIS — E781 Pure hyperglyceridemia: Secondary | ICD-10-CM

## 2020-11-17 LAB — PSA: PSA: <0.04

## 2021-01-14 ENCOUNTER — Other Ambulatory Visit: Payer: Self-pay | Admitting: Family Medicine

## 2021-01-14 DIAGNOSIS — I10 Essential (primary) hypertension: Secondary | ICD-10-CM

## 2021-01-14 NOTE — Telephone Encounter (Signed)
Requested Prescriptions  Pending Prescriptions Disp Refills  . omeprazole (PRILOSEC) 40 MG capsule [Pharmacy Med Name: OMEPRAZOLE DR 40 MG CAPSULE] 90 capsule 1    Sig: TAKE 1 CAPSULE BY MOUTH 30 MINUTES PRIOR TO EVENING MEAL     Gastroenterology: Proton Pump Inhibitors Passed - 01/14/2021  1:45 AM      Passed - Valid encounter within last 12 months    Recent Outpatient Visits          3 months ago Prediabetes   [facility] Birdie Sons, MD   6 months ago Essential hypertension   [facility] Birdie Sons, MD   1 year ago Essential hypertension   [facility] Birdie Sons, MD   1 year ago Annual physical exam   [facility] Birdie Sons, MD   2 years ago Essential hypertension   [facility] Birdie Sons, MD      Future Appointments            In 4 months Fisher, Kirstie Peri, MD [facility], [facility]           . valsartan (DIOVAN) 160 MG tablet [Pharmacy Med Name: VALSARTAN 160 MG TABLET] 90 tablet 1    Sig: TAKE 1 TABLET BY MOUTH EVERY DAY     Cardiovascular:  Angiotensin Receptor Blockers Passed - 01/14/2021  1:45 AM      Passed - Cr in normal range and within 180 days    Creat  Date Value Ref Range Status  05/05/2017 0.77 0.70 - 1.33 mg/dL Final    Comment:    For patients >58 years of age, the reference limit for Creatinine is approximately 13% higher for people identified as African-American. .    Creatinine, Ser  Date Value Ref Range Status  10/10/2020 0.92 0.76 - 1.27 mg/dL Final         Passed - K in normal range and within 180 days    Potassium  Date Value Ref Range Status  10/10/2020 4.7 3.5 - 5.2 mmol/L Final         Passed - Patient is not pregnant      Passed - Last BP in normal range    BP Readings from Last 1 Encounters:  10/10/20 138/88         Passed - Valid encounter within last 6 months    Recent Outpatient Visits          3 months ago  Prediabetes   [facility] Birdie Sons, MD   6 months ago Essential hypertension   [facility] Birdie Sons, MD   1 year ago Essential hypertension   [facility] Birdie Sons, MD   1 year ago Annual physical exam   [facility] Birdie Sons, MD   2 years ago Essential hypertension   [facility] Birdie Sons, MD      Future Appointments            In 4 months Fisher, Kirstie Peri, MD [facility], [place]

## 2021-04-11 ENCOUNTER — Other Ambulatory Visit: Payer: Self-pay | Admitting: Family Medicine

## 2021-04-11 NOTE — Telephone Encounter (Signed)
Requested Prescriptions  Pending Prescriptions Disp Refills  . fluticasone (FLONASE) 50 MCG/ACT nasal spray [Pharmacy Med Name: FLUTICASONE PROP 50 MCG SPRAY] 48 mL 2    Sig: SPRAY 2 SPRAYS INTO EACH NOSTRIL EVERY DAY     Ear, Nose, and Throat: Nasal Preparations - Corticosteroids Passed - 04/11/2021 10:23 AM      Passed - Valid encounter within last 12 months    Recent Outpatient Visits          6 months ago Prediabetes   [facility] Birdie Sons, MD   9 months ago Essential hypertension   [facility] Birdie Sons, MD   1 year ago Essential hypertension   [facility] Birdie Sons, MD   1 year ago Annual physical exam   [facility] Birdie Sons, MD   2 years ago Essential hypertension   [facility] Birdie Sons, MD      Future Appointments            In 2 months Fisher, Kirstie Peri, MD [facility], [place]

## 2021-06-12 ENCOUNTER — Other Ambulatory Visit: Payer: Self-pay

## 2021-06-12 ENCOUNTER — Ambulatory Visit (INDEPENDENT_AMBULATORY_CARE_PROVIDER_SITE_OTHER): Payer: BC Managed Care – PPO | Admitting: Family Medicine

## 2021-06-12 ENCOUNTER — Encounter: Payer: Self-pay | Admitting: Family Medicine

## 2021-06-12 VITALS — BP 151/100 | HR 96 | Temp 99.4°F | Ht 68.5 in | Wt 211.0 lb

## 2021-06-12 DIAGNOSIS — R7303 Prediabetes: Secondary | ICD-10-CM

## 2021-06-12 DIAGNOSIS — I10 Essential (primary) hypertension: Secondary | ICD-10-CM

## 2021-06-12 DIAGNOSIS — Z0000 Encounter for general adult medical examination without abnormal findings: Secondary | ICD-10-CM | POA: Diagnosis not present

## 2021-06-12 DIAGNOSIS — E785 Hyperlipidemia, unspecified: Secondary | ICD-10-CM | POA: Diagnosis not present

## 2021-06-12 DIAGNOSIS — J439 Emphysema, unspecified: Secondary | ICD-10-CM

## 2021-06-12 DIAGNOSIS — Z114 Encounter for screening for human immunodeficiency virus [HIV]: Secondary | ICD-10-CM

## 2021-06-12 DIAGNOSIS — K219 Gastro-esophageal reflux disease without esophagitis: Secondary | ICD-10-CM

## 2021-06-12 NOTE — Patient Instructions (Addendum)
Please review the attached list of medications and notify my office if there are any errors.   Please quit smoking. Let me know if you want to try a prescription medication such as Zyban or Chantix to help you stop  Please go to [place] on [address] or at [facility] on [address] to get your blood drawn  We'll need to ADD another blood pressure medication to the valsartan you are currently taking. I'll call a new medication into your pharmacy after reviewing the labs.   Screening for lung cancer is recommended for people between 65 and 24 years of age who have smoked the equivalent of 1 pack per day for 20 years.  You should get a call from the [facility] to schedule this

## 2021-06-12 NOTE — Progress Notes (Signed)
Complete physical exam   Patient: Troy Cuevas   DOB: 1960/12/09   60 y.o. Male  MRN: [id number] Visit Date: 06/12/2021  Today's healthcare provider: [name], MD   No chief complaint on file.  Subjective    Troy Cuevas is a 60 y.o. male who presents today for a complete physical exam.  He reports consuming a general diet.  Exercises regularly.  He generally feels well. He reports sleeping well. He does not have additional problems to discuss today.      Past Medical History:  Diagnosis Date   Basal cell carcinoma    GERD (gastroesophageal reflux disease)    History of chicken pox    Prostate cancer ([place])    Psoriasis    Past Surgical History:  Procedure Laterality Date   CT SCAN  07/18/2012   CT of  Abdomen; Scattered Diverticulosis of left colon   CT Scan of chest  06/12/2012   with contrast; 3 tiny nodules in both Lungs. Recommned repeat in 6 months. Otherwise normal   Myocardial Perfusion Scan  06/14/2012   Normal   UPPER GI ENDOSCOPY  07/31/2012   Dr. Dionne Milo, H. Pylori neg; Normal biopsy gastric antrum   Social History   Socioeconomic History   Marital status: Married    Spouse name: Not on file   Number of children: Not on file   Years of education: 12   Highest education level: Not on file  Occupational History   Occupation: HVAC    Comment: Employed  Tobacco Use   Smoking status: Every Day    Packs/day: 1.00    Years: 35.00    Pack years: 35.00    Types: Cigarettes    Last attempt to quit: 05/25/2012    Years since quitting: 9.0   Smokeless tobacco: Never   Tobacco comments:    up to 1 ppd since teenage  Vaping Use   Vaping Use: Never used  Substance and Sexual Activity   Alcohol use: Yes    Alcohol/week: 0.0 standard drinks    Comment: occasional    Drug use: No   Sexual activity: Not on file  Other Topics Concern   Not on file  Social History Narrative   Not on file   Social Determinants of Health   Financial  Resource Strain: Not on file  Food Insecurity: Not on file  Transportation Needs: Not on file  Physical Activity: Not on file  Stress: Not on file  Social Connections: Not on file  Intimate Partner Violence: Not on file   Family Status  Relation Name Status   Mother  Alive   Father  Alive   Sister  Deceased   Sister  Alive   Brother  Alive   Neg Hx  (Not Specified)   Family History  Problem Relation Age of Onset   Hypertension Mother    Diabetes Mother    Hypertension Father    Diabetes Father    Skin cancer Father    Diabetes Sister        type 1   Healthy Sister    Healthy Brother    Colon cancer Neg Hx    Allergies  Allergen Reactions   Lisinopril Cough    Patient Care Team: Birdie Sons, MD as PCP - General (Family Medicine) Lyndon Code, MD as Referring Physician (Radiation Oncology) Velda Shell, PA-C as Physician Assistant (Dermatology) Al Decant, MD as Consulting Physician (Urology) Pa, [facility] (  Optometry)   Medications: Outpatient Medications Prior to Visit  Medication Sig   fluticasone (FLONASE) 50 MCG/ACT nasal spray SPRAY 2 SPRAYS INTO EACH NOSTRIL EVERY DAY   omeprazole (PRILOSEC) 40 MG capsule TAKE 1 CAPSULE BY MOUTH 30 MINUTES PRIOR TO EVENING MEAL   pravastatin (PRAVACHOL) 20 MG tablet TAKE 1 TABLET BY MOUTH EVERY DAY   TALTZ 80 MG/ML SOAJ    valsartan (DIOVAN) 160 MG tablet TAKE 1 TABLET BY MOUTH EVERY DAY   No facility-administered medications prior to visit.    Review of Systems  Constitutional: Negative.   HENT: Negative.    Eyes: Negative.   Respiratory: Negative.    Cardiovascular: Negative.   Gastrointestinal: Negative.   Endocrine: Negative.   Genitourinary: Negative.   Musculoskeletal: Negative.   Skin: Negative.   Allergic/Immunologic: Negative.   Neurological: Negative.   Hematological: Negative.   Psychiatric/Behavioral: Negative.       Objective    BP (!) 151/100 (BP Location: Left Arm, Patient  Position: Sitting, Cuff Size: Large)    Pulse 96    Temp 99.4 F (37.4 C) (Oral)    Ht 5' 8.5" (1.74 m)    Wt 211 lb (95.7 kg)    SpO2 98%    BMI 31.62 kg/m    Physical Exam    General Appearance:    Mildly obese male. Alert, cooperative, in no acute distress, appears stated age  Head:    Normocephalic, without obvious abnormality, atraumatic  Eyes:    PERRL, conjunctiva/corneas clear, EOM's intact, fundi    benign, both eyes       Ears:    Normal TM's and external ear canals, both ears  Neck:   Supple, symmetrical, trachea midline, no adenopathy;       thyroid:  No enlargement/tenderness/nodules; no carotid   bruit or JVD  Back:     Symmetric, no curvature, ROM normal, no CVA tenderness  Lungs:     Diffuse expiratory wheezes, no focal rales or rhonchi, respirations unlabored  Chest wall:    No tenderness or deformity  Heart:    Normal heart rate. Normal rhythm. No murmurs, rubs, or gallops.  S1 and S2 normal  Abdomen:     Soft, non-tender, bowel sounds active all four quadrants,    no masses, no organomegaly  Genitalia:    deferred  Rectal:    deferred  Extremities:   All extremities are intact. No cyanosis or edema  Pulses:   2+ and symmetric all extremities  Skin:   Skin color, texture, turgor normal, no rashes or lesions  Lymph nodes:   Cervical, supraclavicular, and axillary nodes normal  Neurologic:   CNII-XII intact. Normal strength, sensation and reflexes      throughout     Last depression screening scores PHQ 2/9 Scores 07/04/2020 06/12/2019 06/06/2018  PHQ - 2 Score 0 0 0  PHQ- 9 Score 0 0 1   Last fall risk screening Fall Risk  07/04/2020  Falls in the past year? 0  Number falls in past yr: 0  Injury with Fall? 0   Last Audit-C alcohol use screening Alcohol Use Disorder Test (AUDIT) 07/04/2020  1. How often do you have a drink containing alcohol? 3  2. How many drinks containing alcohol do you have on a typical day when you are drinking? 1  3. How often do you  have six or more drinks on one occasion? 3  AUDIT-C Score 7   A score of 3 or more in  women, and 4 or more in men indicates increased risk for alcohol abuse, EXCEPT if all of the points are from question 1   No results found for any visits on 06/12/21.  Assessment & Plan    Routine Health Maintenance and Physical Exam  Exercise Activities and Dietary recommendations  Goals   None     Immunization History  Administered Date(s) Administered   Hepatitis B, adult 09/15/2017, 10/17/2017   Influenza, Quadrivalent, Recombinant, Inj, Pf 05/19/2019, 05/24/2020   Influenza,inj,Quad PF,6+ Mos 03/21/2015, 04/07/2016   Influenza-Unspecified 04/14/2017, 05/19/2019, 05/22/2021   PFIZER(Purple Top)SARS-COV-2 Vaccination 09/04/2019, 09/25/2019, 07/10/2020   Tdap 03/21/2015   Zoster Recombinat (Shingrix) 09/15/2017, 06/12/2019    Health Maintenance  Topic Date Due   Pneumococcal Vaccine 16-62 Years old (1 - PCV) Never done   HIV Screening  Never done   COVID-19 Vaccine (4 - Booster for Pfizer series) 09/04/2020   COLONOSCOPY (Pts 45-65yrs Insurance coverage will need to be confirmed)  07/31/2022   TETANUS/TDAP  03/20/2025   INFLUENZA VACCINE  Completed   Hepatitis C Screening  Completed   Zoster Vaccines- Shingrix  Completed   HPV VACCINES  Aged Out    Discussed health benefits of physical activity, and encouraged him to engage in regular exercise appropriate for his age and condition.  1. Prediabetes Lab Results  Component Value Date   HGBA1C 6.0 (A) 10/10/2020     2. Hyperlipidemia, unspecified hyperlipidemia type He is tolerating pravastatin well with no adverse effects.   - Comprehensive metabolic panel - CBC - Lipid panel  3. Essential hypertension Uncontrolled. Advised will add another agent I.e. amlodipine or hctz after reviewing labs.  - EKG 12-Lead  4. Gastroesophageal reflux disease, unspecified whether esophagitis present Well controlled with omeprazole.   5.  Pulmonary emphysema, unspecified emphysema type ([place]) Strongly encouraged smoking cessation.   6. Encounter for screening for HIV  - HIV Antibody (routine testing w rflx)       The entirety of the information documented in the History of Present Illness, Review of Systems and Physical Exam were personally obtained by me. Portions of this information were initially documented by the CMA and reviewed by me for thoroughness and accuracy.     [name], MD  [facility] [phone] (phone) [phone] (fax)  [place]

## 2021-06-13 LAB — COMPREHENSIVE METABOLIC PANEL
ALT: 35 IU/L (ref 0–44)
AST: 36 IU/L (ref 0–40)
Albumin/Globulin Ratio: 1.9 (ref 1.2–2.2)
Albumin: 4.5 g/dL (ref 3.8–4.9)
Alkaline Phosphatase: 72 IU/L (ref 44–121)
BUN/Creatinine Ratio: 13 (ref 9–20)
BUN: 11 mg/dL (ref 6–24)
Bilirubin Total: 0.3 mg/dL (ref 0.0–1.2)
CO2: 22 mmol/L (ref 20–29)
Calcium: 8.7 mg/dL (ref 8.7–10.2)
Chloride: 95 mmol/L — ABNORMAL LOW (ref 96–106)
Creatinine, Ser: 0.86 mg/dL (ref 0.76–1.27)
Globulin, Total: 2.4 g/dL (ref 1.5–4.5)
Glucose: 102 mg/dL — ABNORMAL HIGH (ref 70–99)
Potassium: 4.6 mmol/L (ref 3.5–5.2)
Sodium: 133 mmol/L — ABNORMAL LOW (ref 134–144)
Total Protein: 6.9 g/dL (ref 6.0–8.5)
eGFR: 100 mL/min/{1.73_m2} (ref >59)

## 2021-06-13 LAB — CBC
Hematocrit: 42.7 % (ref 37.5–51.0)
Hemoglobin: 14.9 g/dL (ref 13.0–17.7)
MCH: 31.3 pg (ref 26.6–33.0)
MCHC: 34.9 g/dL (ref 31.5–35.7)
MCV: 90 fL (ref 79–97)
Platelets: 225 10*3/uL (ref 150–450)
RBC: 4.76 x10E6/uL (ref 4.14–5.80)
RDW: 11.6 % (ref 11.6–15.4)
WBC: 6.6 10*3/uL (ref 3.4–10.8)

## 2021-06-13 LAB — LIPID PANEL
Chol/HDL Ratio: 3.9 ratio (ref 0.0–5.0)
Cholesterol, Total: 132 mg/dL (ref 100–199)
HDL: 34 mg/dL — ABNORMAL LOW (ref >39)
LDL Chol Calc (NIH): 80 mg/dL (ref 0–99)
Triglycerides: 97 mg/dL (ref 0–149)
VLDL Cholesterol Cal: 18 mg/dL (ref 5–40)

## 2021-06-13 LAB — HIV ANTIBODY (ROUTINE TESTING W REFLEX): HIV Screen 4th Generation wRfx: NONREACTIVE

## 2021-06-23 ENCOUNTER — Telehealth: Payer: Self-pay

## 2021-06-23 MED ORDER — AMLODIPINE BESYLATE 5 MG PO TABS: 5.0000 mg | ORAL_TABLET | Freq: Every day | ORAL | 1 refills | Status: DC

## 2021-06-23 NOTE — Telephone Encounter (Signed)
Patient advised as directed below. Prescription [facility] to [facility] [place] and scheduled patient for 4 week follow up

## 2021-06-23 NOTE — Telephone Encounter (Signed)
-----   Message from Birdie Sons, MD sent at 06/22/2021 11:19 AM EST ----- Labs are stable. Need to add additional medication to get BP under control. Please send prescription for amlodipine 5mg  one tablet daily, #30, rf x 1 to take in addition to Valsartan. Please schedule follow up for BP in 4 weeks.

## 2021-07-07 ENCOUNTER — Other Ambulatory Visit: Payer: Self-pay | Admitting: Family Medicine

## 2021-07-07 NOTE — Telephone Encounter (Signed)
Requested Prescriptions  Pending Prescriptions Disp Refills   omeprazole (PRILOSEC) 40 MG capsule [Pharmacy Med Name: OMEPRAZOLE DR 40 MG CAPSULE] 90 capsule 3    Sig: TAKE 1 CAPSULE BY MOUTH 30 MINUTES PRIOR TO EVENING MEAL     Gastroenterology: Proton Pump Inhibitors Passed - 07/07/2021  1:35 AM      Passed - Valid encounter within last 12 months    Recent Outpatient Visits          3 weeks ago Annual physical exam   [facility] Birdie Sons, MD   9 months ago Prediabetes   [facility] Birdie Sons, MD   1 year ago Essential hypertension   [facility] Birdie Sons, MD   1 year ago Essential hypertension   [facility] Birdie Sons, MD   2 years ago Annual physical exam   [facility] Birdie Sons, MD      Future Appointments            In 2 weeks Fisher, Kirstie Peri, MD [facility], [place]

## 2021-07-10 ENCOUNTER — Other Ambulatory Visit: Payer: Self-pay | Admitting: Family Medicine

## 2021-07-10 DIAGNOSIS — I10 Essential (primary) hypertension: Secondary | ICD-10-CM

## 2021-07-10 NOTE — Telephone Encounter (Signed)
Requested Prescriptions  Pending Prescriptions Disp Refills   valsartan (DIOVAN) 160 MG tablet [Pharmacy Med Name: VALSARTAN 160 MG TABLET] 90 tablet 1    Sig: TAKE 1 TABLET BY MOUTH EVERY DAY     Cardiovascular:  Angiotensin Receptor Blockers Failed - 07/10/2021  1:27 AM      Failed - Last BP in normal range    BP Readings from Last 1 Encounters:  06/12/21 (!) 151/100         Passed - Cr in normal range and within 180 days    Creat  Date Value Ref Range Status  05/05/2017 0.77 0.70 - 1.33 mg/dL Final    Comment:    For patients >20 years of age, the reference limit for Creatinine is approximately 13% higher for people identified as African-American. .    Creatinine, Ser  Date Value Ref Range Status  06/12/2021 0.86 0.76 - 1.27 mg/dL Final         Passed - K in normal range and within 180 days    Potassium  Date Value Ref Range Status  06/12/2021 4.6 3.5 - 5.2 mmol/L Final         Passed - Patient is not pregnant      Passed - Valid encounter within last 6 months    Recent Outpatient Visits          4 weeks ago Annual physical exam   [facility] Birdie Sons, MD   9 months ago Prediabetes   [facility] Birdie Sons, MD   1 year ago Essential hypertension   [facility] Birdie Sons, MD   1 year ago Essential hypertension   [facility] Birdie Sons, MD   2 years ago Annual physical exam   [facility] Birdie Sons, MD      Future Appointments            In 1 week Fisher, Kirstie Peri, MD [facility], [place]

## 2021-07-16 ENCOUNTER — Other Ambulatory Visit: Payer: Self-pay | Admitting: Family Medicine

## 2021-07-16 NOTE — Telephone Encounter (Signed)
Requested medication (s) are due for refill today:   No  Requested medication (s) are on the active medication list:   Yes  Future visit scheduled:   Yes for a f/u for BP 1/24 with Dr. Caryn Section.   Just started on this medication   Last ordered: 06/23/2021 #30, 1 refill  Returned for provider to review for further refills prior to f/u appt.    Requested Prescriptions  Pending Prescriptions Disp Refills   amLODipine (NORVASC) 5 MG tablet [Pharmacy Med Name: AMLODIPINE BESYLATE 5 MG TAB] 30 tablet 1    Sig: Take 1 tablet (5 mg total) by mouth daily.     Cardiovascular:  Calcium Channel Blockers Failed - 07/16/2021  1:30 PM      Failed - Last BP in normal range    BP Readings from Last 1 Encounters:  06/12/21 (!) 151/100          Passed - Valid encounter within last 6 months    Recent Outpatient Visits           1 month ago Annual physical exam   [facility] Birdie Sons, MD   9 months ago Prediabetes   [facility] Birdie Sons, MD   1 year ago Essential hypertension   [facility] Birdie Sons, MD   1 year ago Essential hypertension   [facility] Birdie Sons, MD   2 years ago Annual physical exam   Graf, MD       Future Appointments             In 5 days Fisher, Kirstie Peri, MD [facility], [place]

## 2021-07-21 ENCOUNTER — Other Ambulatory Visit: Payer: Self-pay

## 2021-07-21 ENCOUNTER — Encounter: Payer: Self-pay | Admitting: Family Medicine

## 2021-07-21 ENCOUNTER — Ambulatory Visit: Payer: BC Managed Care – PPO | Admitting: Family Medicine

## 2021-07-21 VITALS — BP 140/102 | HR 80 | Temp 98.3°F | Resp 16 | Wt 208.0 lb

## 2021-07-21 DIAGNOSIS — I10 Essential (primary) hypertension: Secondary | ICD-10-CM

## 2021-07-21 MED ORDER — AMLODIPINE BESYLATE 10 MG PO TABS: 10.0000 mg | ORAL_TABLET | Freq: Every day | ORAL | 3 refills | Status: DC

## 2021-07-21 NOTE — Progress Notes (Signed)
Established patient visit   Patient: Troy Cuevas   DOB: [birth year]   61 y.o. Male  MRN: [id number] Visit Date: 07/21/2021  Today's healthcare provider: [name], MD   Chief Complaint  Patient presents with   Hypertension   Subjective    HPI  Hypertension, follow-up  BP Readings from Last 3 Encounters:  07/21/21 (!) 140/102  06/12/21 (!) 151/100  10/10/20 138/88   Wt Readings from Last 3 Encounters:  07/21/21 208 lb (94.3 kg)  06/12/21 211 lb (95.7 kg)  10/10/20 221 lb 6.4 oz (100.4 kg)     He was last seen for hypertension 5 weeks ago.  BP at that visit was 151/100. Management since that visit includes begin taking amlodipine $RemoveBeforeDEI'5mg'TTYubJcsawUETNJk$  one tablet daily in addition to Valsartan.  He reports good compliance with treatment. He is not having side effects.  He is following a Regular diet. He is exercising. He does smoke.  Use of agents associated with hypertension: none.   Outside blood pressures are 130/ 75. Symptoms: No chest pain No chest pressure  No palpitations No syncope  No dyspnea No orthopnea  No paroxysmal nocturnal dyspnea No lower extremity edema   Pertinent labs: Lab Results  Component Value Date   CHOL 132 06/12/2021   HDL 34 (L) 06/12/2021   LDLCALC 80 06/12/2021   TRIG 97 06/12/2021   CHOLHDL 3.9 06/12/2021   Lab Results  Component Value Date   NA 133 (L) 06/12/2021   K 4.6 06/12/2021   CREATININE 0.86 06/12/2021   EGFR 100 06/12/2021   GLUCOSE 102 (H) 06/12/2021   TSH 0.886 07/04/2020     The 10-year ASCVD risk score (Arnett DK, et al., 2019) is: 15.7%   ---------------------------------------------------------------------------------------------------   Medications: Outpatient Medications Prior to Visit  Medication Sig   amLODipine (NORVASC) 5 MG tablet Take 1 tablet (5 mg total) by mouth daily.   fluticasone (FLONASE) 50 MCG/ACT nasal spray SPRAY 2 SPRAYS INTO EACH NOSTRIL EVERY DAY   omeprazole (PRILOSEC) 40 MG  capsule TAKE 1 CAPSULE BY MOUTH 30 MINUTES PRIOR TO EVENING MEAL   pravastatin (PRAVACHOL) 20 MG tablet TAKE 1 TABLET BY MOUTH EVERY DAY   TALTZ 80 MG/ML SOAJ    valsartan (DIOVAN) 160 MG tablet TAKE 1 TABLET BY MOUTH EVERY DAY   No facility-administered medications prior to visit.    Review of Systems  Constitutional:  Negative for appetite change, chills and fever.  Respiratory:  Negative for chest tightness, shortness of breath and wheezing.   Cardiovascular:  Negative for chest pain and palpitations.  Gastrointestinal:  Negative for abdominal pain, nausea and vomiting.       Objective    BP (!) 140/102 (BP Location: Right Arm, Patient Position: Sitting, Cuff Size: Large)    Pulse 80    Temp 98.3 F (36.8 C) (Oral)    Resp 16    Wt 208 lb (94.3 kg)    SpO2 100% Comment: room air   BMI 31.17 kg/m    General appearance: Mildly obese male, cooperative and in no acute distress Head: Normocephalic, without obvious abnormality, atraumatic Respiratory: Respirations even and unlabored, normal respiratory rate Extremities: All extremities are intact.  Skin: Skin color, texture, turgor normal. No rashes seen  Psych: Appropriate mood and affect. Neurologic: Mental status: Alert, oriented to person, place, and time, thought content appropriate.    Assessment & Plan     1. Essential hypertension Improved but no to goal. Is tolerating current  medications. Will increase from $RemoveBefor'5mg'vRGOlZxiUcYT$  to amLODipine (NORVASC) 10 MG tablet; Take 1 tablet (10 mg total) by mouth daily.  Dispense: 30 tablet; Refill: 3    Future Appointments  Date Time Provider [place]  10/26/2021  3:20 PM Caryn Section Kirstie Peri, MD [facility]        The entirety of the information documented in the History of Present Illness, Review of Systems and Physical Exam were personally obtained by me. Portions of this information were initially documented by the CMA and reviewed by me for thoroughness and accuracy.     [name],  MD  [facility] [phone] (phone) [phone] (fax)  [place]

## 2021-10-12 ENCOUNTER — Other Ambulatory Visit: Payer: Self-pay | Admitting: Family Medicine

## 2021-10-12 DIAGNOSIS — I10 Essential (primary) hypertension: Secondary | ICD-10-CM

## 2021-10-26 ENCOUNTER — Ambulatory Visit: Payer: BC Managed Care – PPO | Admitting: Family Medicine

## 2021-10-26 ENCOUNTER — Encounter: Payer: Self-pay | Admitting: Family Medicine

## 2021-10-26 VITALS — BP 128/86 | HR 87 | Temp 98.5°F | Resp 14 | Wt 209.0 lb

## 2021-10-26 DIAGNOSIS — E781 Pure hyperglyceridemia: Secondary | ICD-10-CM | POA: Diagnosis not present

## 2021-10-26 DIAGNOSIS — I10 Essential (primary) hypertension: Secondary | ICD-10-CM

## 2021-10-26 MED ORDER — PRAVASTATIN SODIUM 20 MG PO TABS: 20.0000 mg | ORAL_TABLET | Freq: Every day | ORAL | 4 refills | Status: DC

## 2021-10-26 NOTE — Progress Notes (Signed)
I,Roshena L Chambers,acting as a scribe for [name], MD.,have documented all relevant documentation on the behalf of [name], MD,as directed by  [name], MD while in the presence of [name], MD.    Established patient visit   Patient: Troy Cuevas   DOB: 03/07/1961   61 y.o. Male  MRN: [id number] Visit Date: 10/26/2021  Today's healthcare provider: [name], MD   Chief Complaint  Patient presents with   Hypertension   Subjective    HPI  Hypertension, follow-up  BP Readings from Last 3 Encounters:  10/26/21 137/90  07/21/21 (!) 140/102  06/12/21 (!) 151/100   Wt Readings from Last 3 Encounters:  10/26/21 209 lb (94.8 kg)  07/21/21 208 lb (94.3 kg)  06/12/21 211 lb (95.7 kg)     He was last seen for hypertension 3 months ago.  BP at that visit was 140/102. Management since that visit includes increasing from 64m to amLODipine (NORVASC) 10 MG tablet; Take 1 tablet (10 mg total) by mouth daily.  .Marland Kitchen He reports good compliance with treatment. He is not having side effects.  He is following a Regular diet. He is exercising. He does smoke.  Use of agents associated with hypertension: none.   Outside blood pressures are 130/72. Symptoms: No chest pain No chest pressure  No palpitations No syncope  No dyspnea No orthopnea  No paroxysmal nocturnal dyspnea No lower extremity edema   Pertinent labs Lab Results  Component Value Date   CHOL 132 06/12/2021   HDL 34 (L) 06/12/2021   LDLCALC 80 06/12/2021   TRIG 97 06/12/2021   CHOLHDL 3.9 06/12/2021   Lab Results  Component Value Date   NA 133 (L) 06/12/2021   K 4.6 06/12/2021   CREATININE 0.86 06/12/2021   EGFR 100 06/12/2021   GLUCOSE 102 (H) 06/12/2021   TSH 0.886 07/04/2020     The 10-year ASCVD risk score (Arnett DK, et al., 2019) is: 15.2%  ---------------------------------------------------------------------------------------------------   Medications: Outpatient  Medications Prior to Visit  Medication Sig   amLODipine (NORVASC) 10 MG tablet TAKE 1 TABLET BY MOUTH EVERY DAY   fluticasone (FLONASE) 50 MCG/ACT nasal spray SPRAY 2 SPRAYS INTO EACH NOSTRIL EVERY DAY   omeprazole (PRILOSEC) 40 MG capsule TAKE 1 CAPSULE BY MOUTH 30 MINUTES PRIOR TO EVENING MEAL   pravastatin (PRAVACHOL) 20 MG tablet TAKE 1 TABLET BY MOUTH EVERY DAY   TALTZ 80 MG/ML SOAJ    valsartan (DIOVAN) 160 MG tablet TAKE 1 TABLET BY MOUTH EVERY DAY   No facility-administered medications prior to visit.    Review of Systems  Constitutional:  Negative for appetite change, chills and fever.  Respiratory:  Negative for chest tightness, shortness of breath and wheezing.   Cardiovascular:  Negative for chest pain and palpitations.  Gastrointestinal:  Negative for abdominal pain, nausea and vomiting.  Musculoskeletal:  Positive for neck pain.      Objective    BP 128/86   Pulse 87   Temp 98.5 F (36.9 C) (Oral)   Resp 14   Wt 209 lb (94.8 kg)   SpO2 99% Comment: room air  BMI 31.32 kg/m   Today's Vitals   10/26/21 1521 10/26/21 1525 10/26/21 1601  BP: (!) 143/93 137/90 128/86  Pulse: 87    Resp: 14    Temp: 98.5 F (36.9 C)    TempSrc: Oral    SpO2: 99%    Weight: 209 lb (94.8 kg)  Body mass index is 31.32 kg/m.    Physical Exam   General appearance: Obese male, cooperative and in no acute distress Head: Normocephalic, without obvious abnormality, atraumatic Respiratory: Respirations even and unlabored, normal respiratory rate Extremities: All extremities are intact.  Skin: Skin color, texture, turgor normal. No rashes seen  Psych: Appropriate mood and affect. Neurologic: Mental status: Alert, oriented to person, place, and time, thought content appropriate.    Assessment & Plan     1. Essential hypertension Doing better with increase dose of amlodipine. Continue current medications.    2. Hypertriglyceridemia Out of pravastatin, refilled today  pravastatin (PRAVACHOL) 20 MG tablet; Take 1 tablet (20 mg total) by mouth daily.  Dispense: 90 tablet; Refill: 4  Follow up CPE in December.       The entirety of the information documented in the History of Present Illness, Review of Systems and Physical Exam were personally obtained by me. Portions of this information were initially documented by the CMA and reviewed by me for thoroughness and accuracy.     [name], MD  [facility] [phone] (phone) [phone] (fax)  [place]

## 2021-12-02 ENCOUNTER — Other Ambulatory Visit: Payer: Self-pay | Admitting: Family Medicine

## 2021-12-02 DIAGNOSIS — I10 Essential (primary) hypertension: Secondary | ICD-10-CM

## 2022-01-01 ENCOUNTER — Other Ambulatory Visit: Payer: Self-pay | Admitting: Family Medicine

## 2022-01-01 DIAGNOSIS — I10 Essential (primary) hypertension: Secondary | ICD-10-CM

## 2022-01-01 MED ORDER — AMLODIPINE BESYLATE 10 MG PO TABS: 10.0000 mg | ORAL_TABLET | Freq: Every day | ORAL | 4 refills | Status: DC

## 2022-03-01 ENCOUNTER — Other Ambulatory Visit: Payer: Self-pay | Admitting: Family Medicine

## 2022-03-01 DIAGNOSIS — I10 Essential (primary) hypertension: Secondary | ICD-10-CM

## 2022-04-05 LAB — PSA: PSA: <0.04

## 2022-06-11 NOTE — Progress Notes (Unsigned)
I,[name],acting as a scribe for Troy Huh, MD.,have documented all relevant documentation on the behalf of Troy Huh, MD,as directed by  Troy Huh, MD while in the presence of Troy Huh, MD.   Complete physical exam   Patient: Troy Cuevas   DOB: 06-25-1961   61 y.o. Male  MRN: [id number] Visit Date: 06/15/2022  Today's healthcare provider: [name], MD   Chief Complaint  Patient presents with   Annual Exam   Subjective    Troy Cuevas is a 61 y.o. male who presents today for a complete physical exam.  He reports consuming a general diet. Home exercise routine includes walking. He generally feels well. He reports sleeping well. He does not have additional problems to discuss today.  HPI  Last colonoscopy: 07/31/2012. Last tetanus: 03/21/2015.  Past Medical History:  Diagnosis Date   Basal cell carcinoma    GERD (gastroesophageal reflux disease)    History of chicken pox    Prostate cancer ([place])    Psoriasis    Past Surgical History:  Procedure Laterality Date   CT SCAN  07/18/2012   CT of  Abdomen; Scattered Diverticulosis of left colon   CT Scan of chest  06/12/2012   with contrast; 3 tiny nodules in both Lungs. Recommned repeat in 6 months. Otherwise normal   Myocardial Perfusion Scan  06/14/2012   Normal   UPPER GI ENDOSCOPY  07/31/2012   Dr. Dionne Milo, H. Pylori neg; Normal biopsy gastric antrum   Social History   Socioeconomic History   Marital status: Married    Spouse name: Not on file   Number of children: Not on file   Years of education: 12   Highest education level: Not on file  Occupational History   Occupation: HVAC    Comment: Employed  Tobacco Use   Smoking status: Every Day    Packs/day: 1.00    Years: 35.00    Total pack years: 35.00    Types: Cigarettes    Last attempt to quit: 05/25/2012    Years since quitting: 10.0   Smokeless tobacco: Never   Tobacco comments:    up to 1 ppd since teenager   Vaping Use   Vaping Use: Never used  Substance and Sexual Activity   Alcohol use: Yes    Alcohol/week: 0.0 standard drinks of alcohol    Comment: occasional    Drug use: No   Sexual activity: Not on file  Other Topics Concern   Not on file  Social History Narrative   Not on file   Social Determinants of Health   Financial Resource Strain: Not on file  Food Insecurity: Not on file  Transportation Needs: Not on file  Physical Activity: Not on file  Stress: Not on file  Social Connections: Not on file  Intimate Partner Violence: Not on file   Family Status  Relation Name Status   Mother  Alive   Father  Alive   Sister  Deceased   Sister  Alive   Brother  Alive   Neg Hx  (Not Specified)   Family History  Problem Relation Age of Onset   Hypertension Mother    Diabetes Mother    Hypertension Father    Diabetes Father    Skin cancer Father    Diabetes Sister        type 1   Healthy Sister    Healthy Brother    Colon cancer Neg Hx    Allergies  Allergen Reactions   Lisinopril Cough    Patient Care Team: Birdie Sons, MD as PCP - General (Family Medicine) Lyndon Code, MD as Referring Physician (Radiation Oncology) Velda Shell, PA-C as Physician Assistant (Dermatology) Al Decant, MD as Consulting Physician (Urology) [place], [place] [facility])   Medications: Outpatient Medications Prior to Visit  Medication Sig   amLODipine (NORVASC) 10 MG tablet Take 1 tablet (10 mg total) by mouth daily.   fluticasone (FLONASE) 50 MCG/ACT nasal spray SPRAY 2 SPRAYS INTO EACH NOSTRIL EVERY DAY   omeprazole (PRILOSEC) 40 MG capsule TAKE 1 CAPSULE BY MOUTH 30 MINUTES PRIOR TO EVENING MEAL   pravastatin (PRAVACHOL) 20 MG tablet Take 1 tablet (20 mg total) by mouth daily.   TALTZ 80 MG/ML SOAJ    valsartan (DIOVAN) 160 MG tablet TAKE 1 TABLET BY MOUTH EVERY DAY   No facility-administered medications prior to visit.    Review of Systems  Constitutional:   Negative for chills, diaphoresis and fever.  HENT:  Negative for congestion, ear discharge, ear pain, hearing loss, nosebleeds, sore throat and tinnitus.   Eyes:  Negative for photophobia, pain, discharge and redness.  Respiratory:  Negative for cough, shortness of breath, wheezing and stridor.   Cardiovascular:  Negative for chest pain, palpitations and leg swelling.  Gastrointestinal:  Negative for abdominal pain, blood in stool, constipation, diarrhea, nausea and vomiting.  Endocrine: Negative for polydipsia.  Genitourinary:  Negative for dysuria, flank pain, frequency, hematuria and urgency.  Musculoskeletal:  Negative for back pain, myalgias and neck pain.  Skin:  Negative for rash.  Allergic/Immunologic: Negative for environmental allergies.  Neurological:  Negative for dizziness, tremors, seizures, weakness and headaches.  Hematological:  Does not bruise/bleed easily.  Psychiatric/Behavioral:  Negative for hallucinations and suicidal ideas. The patient is not nervous/anxious.   All other systems reviewed and are negative.     Objective    BP (!) 150/90   Pulse 77   Resp 16   Wt 208 lb (94.3 kg)   SpO2 99%   BMI 31.17 kg/m     Physical Exam   General Appearance:    Overweight male. Alert, cooperative, in no acute distress, appears stated age  Head:    Normocephalic, without obvious abnormality, atraumatic  Eyes:    PERRL, conjunctiva/corneas clear, EOM's intact, fundi    benign, both eyes       Ears:    Normal TM's and external ear canals, both ears  Nose:   Nares normal, septum midline, mucosa normal, no drainage   or sinus tenderness  Throat:   Lips, mucosa, and tongue normal; teeth and gums normal  Neck:   Supple, symmetrical, trachea midline, no adenopathy;       thyroid:  No enlargement/tenderness/nodules; no carotid   bruit or JVD  Back:     Symmetric, no curvature, ROM normal, no CVA tenderness  Lungs:     Clear to auscultation bilaterally, respirations  unlabored  Chest wall:    No tenderness or deformity  Heart:    Normal heart rate. Normal rhythm. No murmurs, rubs, or gallops.  S1 and S2 normal  Abdomen:     Soft, non-tender, bowel sounds active all four quadrants,    no masses, no organomegaly  Genitalia:    deferred  Rectal:    deferred  Extremities:   All extremities are intact. No cyanosis or edema  Pulses:   2+ and symmetric all extremities  Skin:   Skin color, texture, turgor  normal, no rashes or lesions  Lymph nodes:   Cervical, supraclavicular, and axillary nodes normal  Neurologic:   CNII-XII intact. Normal strength, sensation and reflexes      throughout     Last depression screening scores    06/15/2022    9:09 AM 06/12/2021    9:06 AM 07/04/2020    9:08 AM  PHQ 2/9 Scores  PHQ - 2 Score 0 0 0  PHQ- 9 Score 2 1 0   Last fall risk screening    06/15/2022    9:09 AM  [place] in the past year? 0  Number falls in past yr: 0  Injury with Fall? 0  Risk for fall due to : No Fall Risks  Follow up Falls evaluation completed   Last Audit-C alcohol use screening    06/15/2022    9:09 AM  Alcohol Use Disorder Test (AUDIT)  1. How often do you have a drink containing alcohol? 3  2. How many drinks containing alcohol do you have on a typical day when you are drinking? 0  3. How often do you have six or more drinks on one occasion? 0  AUDIT-C Score 3  4. How often during the last year have you found that you were not able to stop drinking once you had started? 0  5. How often during the last year have you failed to do what was normally expected from you because of drinking? 0  6. How often during the last year have you needed a first drink in the morning to get yourself going after a heavy drinking session? 0  7. How often during the last year have you had a feeling of guilt of remorse after drinking? 0  8. How often during the last year have you been unable to remember what happened the night before because  you had been drinking? 0  9. Have you or someone else been injured as a result of your drinking? 0  10. Has a relative or friend or a doctor or another health worker been concerned about your drinking or suggested you cut down? 0  Alcohol Use Disorder Identification Test Final Score (AUDIT) 3   A score of 3 or more in women, and 4 or more in men indicates increased risk for alcohol abuse, EXCEPT if all of the points are from question 1   No results found for any visits on 06/15/22.  Assessment & Plan    Routine Health Maintenance and Physical Exam  Exercise Activities and Dietary recommendations  Goals   None     Immunization History  Administered Date(s) Administered   Hepatitis B, adult 09/15/2017, 10/17/2017   Influenza, Quadrivalent, Recombinant, Inj, Pf 05/19/2019, 05/24/2020   Influenza,inj,Quad PF,6+ Mos 03/21/2015, 04/07/2016   Influenza-Unspecified 04/14/2017, 05/19/2019, 05/22/2021   PFIZER(Purple Top)SARS-COV-2 Vaccination 09/04/2019, 09/25/2019, 07/10/2020   Tdap 03/21/2015   Zoster Recombinat (Shingrix) 09/15/2017, 06/12/2019    Health Maintenance  Topic Date Due   Lung Cancer Screening  06/09/2019   INFLUENZA VACCINE  01/26/2022   COVID-19 Vaccine (4 - 2023-24 season) 02/26/2022   COLONOSCOPY (Pts 45-57yr Insurance coverage will need to be confirmed)  07/31/2022   DTaP/Tdap/Td (2 - Td or Tdap) 03/20/2025   Hepatitis C Screening  Completed   HIV Screening  Completed   Zoster Vaccines- Shingrix  Completed   HPV VACCINES  Aged Out    Discussed health benefits of physical activity, and encouraged him to engage in regular exercise  appropriate for his age and condition.   2. Essential hypertension Doing well current meds, but BP in office not near goal. Will change valsartan to valsartan-hctz with next refill at end of January - CBC  3. Hyperlipidemia, unspecified hyperlipidemia type He is tolerating pravastatin well with no adverse effects.   -  Comprehensive metabolic panel - Lipid panel  4. Pulmonary emphysema, unspecified emphysema type ([place]) Encouraged smoking cessation. Asymptomatic at this time.   5. Smoking greater than 30 pack years  - Ambulatory Referral for Lung Cancer Screening [REF832]  Recommended flu vaccine which he declined.   6. Prediabetes  - Hemoglobin A1c  7. Gastroesophageal reflux disease, unspecified whether esophagitis present Well controlled on pantoprazole which he still has to take every day.   8. Colon cancer screening Will be due in February 2024 and he prefers cologuar     The entirety of the information documented in the History of Present Illness, Review of Systems and Physical Exam were personally obtained by me. Portions of this information were initially documented by the CMA and reviewed by me for thoroughness and accuracy.     Troy Huh, MD  [facility] [phone] (phone) [phone] (fax)  [place]

## 2022-06-15 ENCOUNTER — Encounter: Payer: Self-pay | Admitting: Family Medicine

## 2022-06-15 ENCOUNTER — Ambulatory Visit (INDEPENDENT_AMBULATORY_CARE_PROVIDER_SITE_OTHER): Payer: BC Managed Care – PPO | Admitting: Family Medicine

## 2022-06-15 VITALS — BP 150/90 | HR 77 | Resp 16 | Wt 208.0 lb

## 2022-06-15 DIAGNOSIS — J439 Emphysema, unspecified: Secondary | ICD-10-CM | POA: Diagnosis not present

## 2022-06-15 DIAGNOSIS — Z0000 Encounter for general adult medical examination without abnormal findings: Secondary | ICD-10-CM | POA: Diagnosis not present

## 2022-06-15 DIAGNOSIS — K219 Gastro-esophageal reflux disease without esophagitis: Secondary | ICD-10-CM

## 2022-06-15 DIAGNOSIS — R7303 Prediabetes: Secondary | ICD-10-CM

## 2022-06-15 DIAGNOSIS — E785 Hyperlipidemia, unspecified: Secondary | ICD-10-CM | POA: Diagnosis not present

## 2022-06-15 DIAGNOSIS — I10 Essential (primary) hypertension: Secondary | ICD-10-CM

## 2022-06-15 DIAGNOSIS — Z1211 Encounter for screening for malignant neoplasm of colon: Secondary | ICD-10-CM

## 2022-06-15 DIAGNOSIS — F17210 Nicotine dependence, cigarettes, uncomplicated: Secondary | ICD-10-CM

## 2022-06-16 LAB — HEMOGLOBIN A1C
Est. average glucose Bld gHb Est-mCnc: 140 mg/dL
Hgb A1c MFr Bld: 6.5 % — ABNORMAL HIGH (ref 4.8–5.6)

## 2022-06-16 LAB — COMPREHENSIVE METABOLIC PANEL WITH GFR
ALT: 32 [IU]/L (ref 0–44)
AST: 27 [IU]/L (ref 0–40)
Albumin/Globulin Ratio: 1.7 (ref 1.2–2.2)
Albumin: 4.4 g/dL (ref 3.8–4.9)
Alkaline Phosphatase: 74 [IU]/L (ref 44–121)
BUN/Creatinine Ratio: 13 (ref 10–24)
BUN: 11 mg/dL (ref 8–27)
Bilirubin Total: 0.4 mg/dL (ref 0.0–1.2)
CO2: 23 mmol/L (ref 20–29)
Calcium: 9.3 mg/dL (ref 8.6–10.2)
Chloride: 100 mmol/L (ref 96–106)
Creatinine, Ser: 0.84 mg/dL (ref 0.76–1.27)
Globulin, Total: 2.6 g/dL (ref 1.5–4.5)
Glucose: 110 mg/dL — ABNORMAL HIGH (ref 70–99)
Potassium: 4.8 mmol/L (ref 3.5–5.2)
Sodium: 137 mmol/L (ref 134–144)
Total Protein: 7 g/dL (ref 6.0–8.5)
eGFR: 100 mL/min/{1.73_m2}

## 2022-06-16 LAB — LIPID PANEL
Chol/HDL Ratio: 3.6 ratio (ref 0.0–5.0)
Cholesterol, Total: 120 mg/dL (ref 100–199)
HDL: 33 mg/dL — ABNORMAL LOW
LDL Chol Calc (NIH): 70 mg/dL (ref 0–99)
Triglycerides: 87 mg/dL (ref 0–149)
VLDL Cholesterol Cal: 17 mg/dL (ref 5–40)

## 2022-06-16 LAB — CBC
Hematocrit: 44.3 % (ref 37.5–51.0)
Hemoglobin: 15.6 g/dL (ref 13.0–17.7)
MCH: 31.6 pg (ref 26.6–33.0)
MCHC: 35.2 g/dL (ref 31.5–35.7)
MCV: 90 fL (ref 79–97)
Platelets: 269 10*3/uL (ref 150–450)
RBC: 4.93 x10E6/uL (ref 4.14–5.80)
RDW: 11.2 % — ABNORMAL LOW (ref 11.6–15.4)
WBC: 5.6 10*3/uL (ref 3.4–10.8)

## 2022-07-06 ENCOUNTER — Other Ambulatory Visit: Payer: Self-pay | Admitting: Family Medicine

## 2022-07-06 NOTE — Telephone Encounter (Signed)
Requested Prescriptions  Pending Prescriptions Disp Refills   omeprazole (PRILOSEC) 40 MG capsule [Pharmacy Med Name: OMEPRAZOLE DR 40 MG CAPSULE] 90 capsule 0    Sig: TAKE 1 CAPSULE BY MOUTH 30 MINUTES PRIOR TO EVENING MEAL     Gastroenterology: Proton Pump Inhibitors Passed - 07/06/2022  1:25 AM      Passed - Valid encounter within last 12 months    Recent Outpatient Visits           3 weeks ago Annual physical exam   [facility] Birdie Sons, MD   8 months ago Essential hypertension   [facility] Birdie Sons, MD   11 months ago Essential hypertension   [facility] Birdie Sons, MD   1 year ago Annual physical exam   [facility] Birdie Sons, MD   1 year ago Prediabetes   [facility] Birdie Sons, MD       Future Appointments             In 5 months Fisher, Kirstie Peri, MD [facility], [place]

## 2022-07-14 ENCOUNTER — Other Ambulatory Visit: Payer: Self-pay | Admitting: Family Medicine

## 2022-07-14 DIAGNOSIS — Z1211 Encounter for screening for malignant neoplasm of colon: Secondary | ICD-10-CM

## 2022-08-10 MED ORDER — AMLODIPINE-VALSARTAN-HCTZ 5-160-12.5 MG PO TABS: 1.0000 | ORAL_TABLET | Freq: Every day | ORAL | 3 refills | Status: DC

## 2022-08-11 ENCOUNTER — Ambulatory Visit: Payer: Self-pay

## 2022-08-11 NOTE — Telephone Encounter (Signed)
3rd attempt, Patient called, left VM to return the call to the office to discuss medications with a nurse. Unable to reach patient after 3 attempts by [facility] NT, routing to the provider for resolution per protocol.

## 2022-08-11 NOTE — Telephone Encounter (Signed)
2nd attempt, pt called [facility] to discuss medications.

## 2022-08-11 NOTE — Telephone Encounter (Signed)
Pt called, [facility] to discuss concerns on medications.   Summary: med ?   Patient wants to know if he should stop taking all of his meds.

## 2022-08-12 ENCOUNTER — Telehealth: Payer: Self-pay | Admitting: Family Medicine

## 2022-08-12 NOTE — Telephone Encounter (Signed)
Patient is calling because Dr. Caryn Section changed his medicine and when he picked it up he was told to stop taking all his medication and just take the newly prescribed one. Patient wants to know does he continue to take his medications or does he stop everything but the newly prescribed medication. Please advise patient.

## 2022-08-12 NOTE — Telephone Encounter (Signed)
Patient advised he is to discontinue the old Amlodipine and Valsartan for the ne

## 2022-09-02 LAB — COLOGUARD: COLOGUARD: NEGATIVE

## 2022-09-18 ENCOUNTER — Other Ambulatory Visit: Payer: Self-pay | Admitting: Family Medicine

## 2022-09-20 NOTE — Telephone Encounter (Signed)
Requested Prescriptions  Pending Prescriptions Disp Refills   fluticasone (FLONASE) 50 MCG/ACT nasal spray [Pharmacy Med Name: FLUTICASONE PROP 50 MCG SPRAY] 48 mL 2    Sig: SPRAY 2 SPRAYS INTO EACH NOSTRIL EVERY DAY     Ear, Nose, and Throat: Nasal Preparations - Corticosteroids Passed - 09/18/2022  8:33 AM      Passed - Valid encounter within last 12 months    Recent Outpatient Visits           3 months ago Annual physical exam   [facility] Birdie Sons, MD   10 months ago Essential hypertension   [place] Birdie Sons, MD   1 year ago Essential hypertension   [place], Donald E, MD   1 year ago Annual physical exam   [facility] Birdie Sons, MD   1 year ago [place], Donald E, MD       Future Appointments             In 2 months Fisher, Kirstie Peri, MD [place], [place]

## 2022-10-03 ENCOUNTER — Other Ambulatory Visit: Payer: Self-pay | Admitting: Family Medicine

## 2022-12-17 ENCOUNTER — Encounter: Payer: Self-pay | Admitting: Family Medicine

## 2022-12-17 ENCOUNTER — Ambulatory Visit (INDEPENDENT_AMBULATORY_CARE_PROVIDER_SITE_OTHER): Payer: BC Managed Care – PPO | Admitting: Family Medicine

## 2022-12-17 VITALS — BP 144/88 | HR 81 | Temp 97.8°F | Resp 12 | Ht 69.0 in | Wt 206.0 lb

## 2022-12-17 DIAGNOSIS — R7303 Prediabetes: Secondary | ICD-10-CM | POA: Diagnosis not present

## 2022-12-17 DIAGNOSIS — E785 Hyperlipidemia, unspecified: Secondary | ICD-10-CM | POA: Diagnosis not present

## 2022-12-17 DIAGNOSIS — F17210 Nicotine dependence, cigarettes, uncomplicated: Secondary | ICD-10-CM | POA: Diagnosis not present

## 2022-12-17 DIAGNOSIS — I10 Essential (primary) hypertension: Secondary | ICD-10-CM | POA: Diagnosis not present

## 2022-12-17 LAB — POCT GLYCOSYLATED HEMOGLOBIN (HGB A1C)
Est. average glucose Bld gHb Est-mCnc: 137
Hemoglobin A1C: 6.4 % — AB (ref 4.0–5.6)

## 2022-12-17 NOTE — Progress Notes (Signed)
I,Sulibeya S Dimas,acting as a scribe for Mila Merry, MD.,have documented all relevant documentation on the behalf of Mila Merry, MD,as directed by  Mila Merry, MD   Established patient visit   Patient: Troy Cuevas   DOB: 1960-09-02   62 y.o. Male  MRN: [id number] Visit Date: 12/17/2022  Today's healthcare provider: Mila Merry, MD   Chief Complaint  Patient presents with   Hypertension   Hyperlipidemia   Subjective    HPI  Hypertension, follow-up  BP Readings from Last 3 Encounters:  12/17/22 (!) 140/87  06/15/22 (!) 150/90  10/26/21 128/86   Wt Readings from Last 3 Encounters:  12/17/22 206 lb (93.4 kg)  06/15/22 208 lb (94.3 kg)  10/26/21 209 lb (94.8 kg)     He was last seen for hypertension 6 months ago.  BP at that visit was 150/90. Management since that visit includes change to valsartan to valsartan-hctz. He reports excellent compliance with treatment. He is not having side effects.   Outside blood pressures are 130/70s. --------------------------------------------------------------------------------------------------- Lipid/Cholesterol, follow-up  Last Lipid Panel: Lab Results  Component Value Date   CHOL 120 06/15/2022   LDLCALC 70 06/15/2022   HDL 33 (L) 06/15/2022   TRIG 87 06/15/2022    He was last seen for this 6 months ago.  Management since that visit includes no changes.  He reports excellent compliance with treatment. He is not having side effects.   Symptoms: No appetite changes No foot ulcerations  No chest pain No chest pressure/discomfort  No dyspnea No orthopnea  No fatigue No lower extremity edema  No palpitations No paroxysmal nocturnal dyspnea  No nausea No numbness or tingling of extremity  No polydipsia No polyuria  No speech difficulty No syncope    Last metabolic panel Lab Results  Component Value Date   GLUCOSE 110 (H) 06/15/2022   NA 137 06/15/2022   K 4.8 06/15/2022   BUN 11 06/15/2022    CREATININE 0.84 06/15/2022   EGFR 100 06/15/2022   GFRNONAA 94 07/04/2020   CALCIUM 9.3 06/15/2022   AST 27 06/15/2022   ALT 32 06/15/2022    --------------------------------------------------------------------------------------------------- He is also due for follow up prediabetes Lab Results  Component Value Date   HGBA1C 6.5 (H) 06/15/2022    Medications: Outpatient Medications Prior to Visit  Medication Sig   amLODIPine-Valsartan-HCTZ 5-160-12.5 MG TABS Take 1 tablet by mouth daily.   fluticasone (FLONASE) 50 MCG/ACT nasal spray SPRAY 2 SPRAYS INTO EACH NOSTRIL EVERY DAY   omeprazole (PRILOSEC) 40 MG capsule TAKE 1 CAPSULE BY MOUTH 30 MINUTES PRIOR TO EVENING MEAL   pravastatin (PRAVACHOL) 20 MG tablet Take 1 tablet (20 mg total) by mouth daily.   triamcinolone cream (KENALOG) 0.1 % SMARTSIG:Sparingly Topical Twice Daily PRN   TALTZ 80 MG/ML SOAJ  (Patient not taking: Reported on 12/17/2022)   [DISCONTINUED] amLODipine (NORVASC) 10 MG tablet Take 1 tablet (10 mg total) by mouth daily.   No facility-administered medications prior to visit.         Objective    BP (!) 140/87 (BP Location: Left Arm, Patient Position: Sitting, Cuff Size: Large)   Pulse 81   Temp 97.8 F (36.6 C) (Temporal)   Resp 12   Ht 5\' 9"  (1.753 m)   Wt 206 lb (93.4 kg)   SpO2 99%   BMI 30.42 kg/m  BP Readings from Last 3 Encounters:  12/17/22 (!) 140/87  06/15/22 (!) 150/90  10/26/21 128/86   Wt Readings from Last  3 Encounters:  12/17/22 206 lb (93.4 kg)  06/15/22 208 lb (94.3 kg)  10/26/21 209 lb (94.8 kg)    Physical Exam  General appearance: Mildly obese male, cooperative and in no acute distress Head: Normocephalic, without obvious abnormality, atraumatic Respiratory: Respirations even and unlabored, normal respiratory rate Extremities: All extremities are intact.  Skin: Skin color, texture, turgor normal. No rashes seen  Psych: Appropriate mood and affect. Neurologic: Mental  status: Alert, oriented to person, place, and time, thought content appropriate.   HgbA1c=6.4%  Assessment & Plan     1. Essential hypertension BP a little above target in office,  but home readings much better. Continue walking over 10000 steps daily and avoid salty foods and sweets.   2. Hyperlipidemia, unspecified hyperlipidemia type He is tolerating pravastatin well with no adverse effects.    3. Prediabetes Diet controlled.   Future Appointments  Date Time Provider Department Center  07/08/2023  8:20 AM Malva Limes, MD [facility]        The entirety of the information documented in the History of Present Illness, Review of Systems and Physical Exam were personally obtained by me. Portions of this information were initially documented by the CMA and reviewed by me for thoroughness and accuracy.     Mila Merry, MD  [facility] [phone] (phone) [phone] (fax)  [facility]

## 2022-12-17 NOTE — Patient Instructions (Signed)
.   Please review the attached list of medications and notify my office if there are any errors.   . Please bring all of your medications to every appointment so we can make sure that our medication list is the same as yours.   

## 2022-12-27 ENCOUNTER — Ambulatory Visit: Payer: BC Managed Care – PPO | Admitting: Family Medicine

## 2023-01-13 ENCOUNTER — Other Ambulatory Visit: Payer: Self-pay | Admitting: Family Medicine

## 2023-01-13 DIAGNOSIS — E781 Pure hyperglyceridemia: Secondary | ICD-10-CM

## 2023-06-11 ENCOUNTER — Other Ambulatory Visit: Payer: Self-pay | Admitting: Family Medicine

## 2023-06-13 NOTE — Telephone Encounter (Signed)
Requested Prescriptions  Pending Prescriptions Disp Refills   fluticasone (FLONASE) 50 MCG/ACT nasal spray [Pharmacy Med Name: FLUTICASONE PROP 50 MCG SPRAY] 48 mL 2    Sig: SPRAY 2 SPRAYS INTO EACH NOSTRIL EVERY DAY     Ear, Nose, and Throat: Nasal Preparations - Corticosteroids Passed - 06/13/2023 11:50 AM      Passed - Valid encounter within last 12 months    Recent Outpatient Visits           5 months ago Essential hypertension   [place] [facility] Malva Limes, MD   12 months ago Annual physical exam   [facility] Malva Limes, MD   1 year ago Essential hypertension   [place] [facility] Malva Limes, MD   1 year ago Essential hypertension   [place] [facility] Malva Limes, MD   2 years ago Annual physical exam   [facility] Malva Limes, MD       Future Appointments             In 3 weeks Fisher, Demetrios Isaacs, MD [facility]

## 2023-06-22 ENCOUNTER — Other Ambulatory Visit: Payer: Self-pay | Admitting: Family Medicine

## 2023-07-08 ENCOUNTER — Encounter: Payer: Self-pay | Admitting: Family Medicine

## 2023-07-08 ENCOUNTER — Ambulatory Visit: Payer: 59 | Admitting: Family Medicine

## 2023-07-08 VITALS — BP 140/88 | HR 79 | Resp 16 | Ht 69.0 in | Wt 209.1 lb

## 2023-07-08 DIAGNOSIS — N529 Male erectile dysfunction, unspecified: Secondary | ICD-10-CM

## 2023-07-08 DIAGNOSIS — E785 Hyperlipidemia, unspecified: Secondary | ICD-10-CM | POA: Diagnosis not present

## 2023-07-08 DIAGNOSIS — Z8546 Personal history of malignant neoplasm of prostate: Secondary | ICD-10-CM | POA: Diagnosis not present

## 2023-07-08 DIAGNOSIS — I10 Essential (primary) hypertension: Secondary | ICD-10-CM | POA: Diagnosis not present

## 2023-07-08 DIAGNOSIS — R7303 Prediabetes: Secondary | ICD-10-CM | POA: Diagnosis not present

## 2023-07-08 NOTE — Progress Notes (Signed)
 Established patient visit   Patient: Troy Cuevas   DOB: 1960/09/15   63 y.o. Male  MRN: [id number] Visit Date: 07/08/2023  Today's healthcare provider: Nancyann Perry, MD   Chief Complaint  Patient presents with   Medical Management of Chronic Issues   Subjective    HPI  Follow up htn, prediabetes and lipids. Feels well without c/o taking medications consistently without adverse effects. [place] 8-10k steps everyday. Also has history of prostate cancer and PSA have been consistently negative. His urologist left the area and he will be due next PSA in October and would like to have done here. He had also been prescribing sildenafil for the patient and he would also like to have that filled here in the future.   Lab Results  Component Value Date   HGBA1C 6.4 (A) 12/17/2022   HGBA1C 6.5 (H) 06/15/2022   HGBA1C 6.0 (A) 10/10/2020   Lab Results  Component Value Date   NA 137 06/15/2022   K 4.8 06/15/2022   CREATININE 0.84 06/15/2022   EGFR 100 06/15/2022   GLUCOSE 110 (H) 06/15/2022   Lab Results  Component Value Date   CHOL 120 06/15/2022   HDL 33 (L) 06/15/2022   LDLCALC 70 06/15/2022   TRIG 87 06/15/2022   CHOLHDL 3.6 06/15/2022    Medications: Outpatient Medications Prior to Visit  Medication Sig   amLODIPine -Valsartan -HCTZ 5-160-12.5 MG TABS Take 1 tablet by mouth daily.   fluticasone (FLONASE) 50 MCG/ACT nasal spray SPRAY 2 SPRAYS INTO EACH NOSTRIL EVERY DAY   omeprazole (PRILOSEC) 40 MG capsule TAKE 1 CAPSULE BY MOUTH 30 MINUTES PRIOR TO EVENING MEAL   pravastatin  (PRAVACHOL ) 20 MG tablet TAKE 1 TABLET BY MOUTH EVERY DAY   sildenafil (REVATIO) 20 MG tablet Take 40-100 mg by mouth daily as needed.   triamcinolone cream (KENALOG) 0.1 % SMARTSIG:Sparingly Topical Twice Daily PRN   No facility-administered medications prior to visit.    Review of Systems  Constitutional:  Negative for appetite change, chills and fever.  Respiratory:  Negative for chest  tightness, shortness of breath and wheezing.   Cardiovascular:  Negative for chest pain and palpitations.  Gastrointestinal:  Negative for abdominal pain, nausea and vomiting.       Objective    BP (!) 140/88 (BP Location: Left Arm, Patient Position: Sitting, Cuff Size: Large)   Pulse 79   Resp 16   Ht 5' 9 (1.753 m)   Wt 209 lb 1.6 oz (94.8 kg)   SpO2 100%   BMI 30.88 kg/m    Physical Exam   General: Appearance:    Mildly obese male in no acute distress  Eyes:    PERRL, conjunctiva/corneas clear, EOM's intact       Lungs:     Clear to auscultation bilaterally, respirations unlabored  Heart:    Normal heart rate. Normal rhythm. No murmurs, rubs, or gallops.    MS:   All extremities are intact.    Neurologic:   Awake, alert, oriented x 3. No apparent focal neurological defect.         Assessment & Plan     1. Essential hypertension (Primary) Well controlled.  Continue current medications.   - CBC - Comprehensive metabolic panel - Lipid panel  2. Prediabetes  - Hemoglobin A1c  3. Hyperlipidemia, unspecified hyperlipidemia type He is tolerating rosuvastatin well with no adverse effects.     4. History of prostate cancer Next PSA due in October which we  can start checking here since his urologist is no longer in the area.   5. Erectile dysfunction, unspecified erectile dysfunction type Continue sildenafil previously prescribed by his urologist.          Nancyann Perry, MD  [facility] [phone] (phone) [phone] (fax)  [facility]

## 2023-07-09 LAB — COMPREHENSIVE METABOLIC PANEL
ALT: 26 [IU]/L (ref 0–44)
AST: 28 [IU]/L (ref 0–40)
Albumin: 4.6 g/dL (ref 3.9–4.9)
Alkaline Phosphatase: 74 [IU]/L (ref 44–121)
BUN/Creatinine Ratio: 13 (ref 10–24)
BUN: 11 mg/dL (ref 8–27)
Bilirubin Total: 0.4 mg/dL (ref 0.0–1.2)
CO2: 24 mmol/L (ref 20–29)
Calcium: 9.4 mg/dL (ref 8.6–10.2)
Chloride: 96 mmol/L (ref 96–106)
Creatinine, Ser: 0.86 mg/dL (ref 0.76–1.27)
Globulin, Total: 2.6 g/dL (ref 1.5–4.5)
Glucose: 122 mg/dL — ABNORMAL HIGH (ref 70–99)
Potassium: 4.4 mmol/L (ref 3.5–5.2)
Sodium: 136 mmol/L (ref 134–144)
Total Protein: 7.2 g/dL (ref 6.0–8.5)
eGFR: 99 mL/min/{1.73_m2} (ref >59)

## 2023-07-09 LAB — CBC
Hematocrit: 45.9 % (ref 37.5–51.0)
Hemoglobin: 15.6 g/dL (ref 13.0–17.7)
MCH: 31.5 pg (ref 26.6–33.0)
MCHC: 34 g/dL (ref 31.5–35.7)
MCV: 93 fL (ref 79–97)
Platelets: 338 10*3/uL (ref 150–450)
RBC: 4.95 x10E6/uL (ref 4.14–5.80)
RDW: 11.5 % — ABNORMAL LOW (ref 11.6–15.4)
WBC: 7.1 10*3/uL (ref 3.4–10.8)

## 2023-07-09 LAB — LIPID PANEL
Chol/HDL Ratio: 3.9 {ratio} (ref 0.0–5.0)
Cholesterol, Total: 154 mg/dL (ref 100–199)
HDL: 40 mg/dL (ref >39)
LDL Chol Calc (NIH): 93 mg/dL (ref 0–99)
Triglycerides: 116 mg/dL (ref 0–149)
VLDL Cholesterol Cal: 21 mg/dL (ref 5–40)

## 2023-07-09 LAB — HEMOGLOBIN A1C
Est. average glucose Bld gHb Est-mCnc: 140 mg/dL
Hgb A1c MFr Bld: 6.5 % — ABNORMAL HIGH (ref 4.8–5.6)

## 2023-08-01 ENCOUNTER — Other Ambulatory Visit: Payer: Self-pay | Admitting: Family Medicine

## 2023-09-18 ENCOUNTER — Other Ambulatory Visit: Payer: Self-pay | Admitting: Family Medicine

## 2023-12-07 ENCOUNTER — Other Ambulatory Visit: Payer: Self-pay | Admitting: Family Medicine

## 2024-01-09 ENCOUNTER — Other Ambulatory Visit: Payer: Self-pay

## 2024-01-09 ENCOUNTER — Encounter: Payer: Self-pay | Admitting: Family Medicine

## 2024-01-09 ENCOUNTER — Ambulatory Visit: Payer: Self-pay | Admitting: Family Medicine

## 2024-01-09 VITALS — BP 121/87 | HR 82 | Resp 16 | Wt 205.0 lb

## 2024-01-09 DIAGNOSIS — R7303 Prediabetes: Secondary | ICD-10-CM

## 2024-01-09 DIAGNOSIS — F17210 Nicotine dependence, cigarettes, uncomplicated: Secondary | ICD-10-CM

## 2024-01-09 DIAGNOSIS — Z122 Encounter for screening for malignant neoplasm of respiratory organs: Secondary | ICD-10-CM

## 2024-01-09 DIAGNOSIS — Z87891 Personal history of nicotine dependence: Secondary | ICD-10-CM

## 2024-01-09 DIAGNOSIS — I10 Essential (primary) hypertension: Secondary | ICD-10-CM

## 2024-01-09 LAB — POCT GLYCOSYLATED HEMOGLOBIN (HGB A1C)
Est. average glucose Bld gHb Est-mCnc: 140
Hemoglobin A1C: 6.5 % — AB (ref 4.0–5.6)

## 2024-01-09 NOTE — Progress Notes (Addendum)
 Established patient visit   Patient: Troy Cuevas   DOB: [birth year]   63 y.o. Male  MRN: [id number] Visit Date: 01/09/2024  Today's healthcare provider: Nancyann Perry, MD   Chief Complaint  Patient presents with   Medical Management of Chronic Issues    Follow-up HTN and prediabetes   Subjective    HPI Discussed the use of AI scribe software for clinical note transcription with the patient, who gave verbal consent to proceed.  History of Present Illness   Troy Cuevas is a 63 year old male with hypertension and borderline diabetes who presents for a routine follow-up visit.  He engages in regular physical activity, including walking, and has been traveling recently. His wife encourages him to walk more, contributing to his weight loss. He has lost a couple of pounds since his last visit. He is mindful of his diet, reducing portion sizes and cutting back on sweets. He used to eat two plates but now limits himself to one plate and has reduced his intake of biscuits and sweets. His A1c is 6.5, which is in the borderline diabetic range.  No chest pain, heart flutters, or shortness of breath. He feels more breathless when working hard, attributing it to aging.  He continues to smoke, although he has cut back significantly. He smokes a little over half a pack, mainly in the evenings and weekends when socializing with friends. He smoked one cigarette this morning. He has not had a lung scan in about five years, despite being a smoker. He recalls having lung scans in the past, but not recently.  He continues on amlodipine -hctz for hypertension and tolerating well.       Lab Results  Component Value Date   HGBA1C 6.5 (A) 01/09/2024   HGBA1C 6.5 (H) 07/08/2023   HGBA1C 6.4 (A) 12/17/2022   BP Readings from Last 3 Encounters:  01/09/24 121/87  07/08/23 (!) 140/88  12/17/22 (!) 144/88   Wt Readings from Last 3 Encounters:  01/09/24 205 lb (93 kg)  07/08/23 209 lb  1.6 oz (94.8 kg)  12/17/22 206 lb (93.4 kg)     Medications: Outpatient Medications Prior to Visit  Medication Sig   amLODIPine -Valsartan -HCTZ 5-160-12.5 MG TABS TAKE 1 TABLET BY MOUTH EVERY DAY   fluticasone (FLONASE) 50 MCG/ACT nasal spray SPRAY 2 SPRAYS INTO EACH NOSTRIL EVERY DAY   omeprazole (PRILOSEC) 40 MG capsule TAKE 1 CAPSULE BY MOUTH 30 MINUTES PRIOR TO EVENING MEAL   pravastatin  (PRAVACHOL ) 20 MG tablet TAKE 1 TABLET BY MOUTH EVERY DAY   sildenafil (REVATIO) 20 MG tablet Take 40-100 mg by mouth daily as needed.   triamcinolone cream (KENALOG) 0.1 % SMARTSIG:Sparingly Topical Twice Daily PRN   No facility-administered medications prior to visit.    Social History   Socioeconomic History   Marital status: Married    Spouse name: Not on file   Number of children: Not on file   Years of education: 12   Highest education level: Not on file  Occupational History   Occupation: HVAC    Comment: Employed  Tobacco Use   Smoking status: Every Day    Current packs/day: 0.00    Average packs/day: 1 pack/day for 35.0 years (35.0 ttl pk-yrs)    Types: Cigarettes    Start date: 05/25/1977    Last attempt to quit: 05/25/2012    Years since quitting: 11.6   Smokeless tobacco: Never   Tobacco comments:    up to 1  ppd since teenager  Vaping Use   Vaping status: Never Used  Substance and Sexual Activity   Alcohol use: Yes    Alcohol/week: 0.0 standard drinks of alcohol    Comment: occasional    Drug use: No   Sexual activity: Not on file  Other Topics Concern   Not on file  Social History Narrative   Not on file   Social Drivers of Health   Financial Resource Strain: Not on file  Food Insecurity: Not on file  Transportation Needs: Not on file  Physical Activity: Not on file  Stress: Not on file  Social Connections: Not on file  Intimate Partner Violence: Not on file        Objective    BP 121/87 (BP Location: Left Arm, Patient Position: Sitting, Cuff Size:  Normal)   Pulse 82   Resp 16   Wt 205 lb (93 kg)   SpO2 98%   BMI 30.27 kg/m    Physical Exam   General appearance: Mildly obese male, cooperative and in no acute distress Head: Normocephalic, without obvious abnormality, atraumatic Respiratory: Respirations even and unlabored, normal respiratory rate Extremities: All extremities are intact.  Skin: Skin color, texture, turgor normal. No rashes seen  Psych: Appropriate mood and affect. Neurologic: Mental status: Alert, oriented to person, place, and time, thought content appropriate.   Results for orders placed or performed in visit on 01/09/24  POCT glycosylated hemoglobin (Hb A1C)  Result Value Ref Range   Hemoglobin A1C 6.5 (A) 4.0 - 5.6 %   Est. average glucose Bld gHb Est-mCnc 140     Assessment & Plan     1. Primary hypertension Well controlled.  Continue current medications.    2. Prediabetes (Primary) Well controlled.  Continue current medications.    3. Smoking greater than 30 pack years  - Ambulatory Referral for Lung Cancer Screening [REF832]   Return in about 6 months (around 07/11/2024) for Yearly Physical.         Nancyann Perry, MD  [facility] [phone] (phone) [phone] (fax)  [facility]

## 2024-01-09 NOTE — Patient Instructions (Signed)
 Marland Kitchen  Please review the attached list of medications and notify my office if there are any errors.   . Please bring all of your medications to every appointment so we can make sure that our medication list is the same as yours.

## 2024-01-28 ENCOUNTER — Other Ambulatory Visit: Payer: Self-pay | Admitting: Family Medicine

## 2024-01-28 DIAGNOSIS — E781 Pure hyperglyceridemia: Secondary | ICD-10-CM

## 2024-02-06 HISTORY — PX: MOHS SURGERY: SHX181

## 2024-02-08 ENCOUNTER — Encounter: Payer: Self-pay | Admitting: Family Medicine

## 2024-02-08 DIAGNOSIS — Z86007 Personal history of in-situ neoplasm of skin: Secondary | ICD-10-CM | POA: Insufficient documentation

## 2024-06-01 ENCOUNTER — Other Ambulatory Visit: Payer: Self-pay | Admitting: Family Medicine

## 2024-08-06 ENCOUNTER — Encounter: Payer: Self-pay | Admitting: Family Medicine

## 2024-08-10 ENCOUNTER — Encounter: Payer: Self-pay | Admitting: Family Medicine
